# Patient Record
Sex: Female | Born: 1960 | Race: White | Hispanic: No | Marital: Married | Smoking: Never smoker
Health system: Southern US, Community
[De-identification: ages and names within clinical notes are randomized; demographics above are authoritative.]

## PROBLEM LIST (undated history)

## (undated) DIAGNOSIS — G4700 Insomnia, unspecified: Secondary | ICD-10-CM

## (undated) DIAGNOSIS — M1990 Unspecified osteoarthritis, unspecified site: Secondary | ICD-10-CM

## (undated) DIAGNOSIS — G4730 Sleep apnea, unspecified: Secondary | ICD-10-CM

## (undated) DIAGNOSIS — R51 Headache: Secondary | ICD-10-CM

## (undated) DIAGNOSIS — I82432 Acute embolism and thrombosis of left popliteal vein: Secondary | ICD-10-CM

## (undated) HISTORY — DX: Insomnia, unspecified: G47.00

## (undated) HISTORY — PX: BUNIONECTOMY: SHX129

## (undated) HISTORY — DX: Acute embolism and thrombosis of left popliteal vein: I82.432

## (undated) HISTORY — PX: JOINT REPLACEMENT: SHX530

---

## 2013-06-07 ENCOUNTER — Other Ambulatory Visit: Payer: Self-pay | Admitting: Neurological Surgery

## 2013-07-08 ENCOUNTER — Other Ambulatory Visit: Payer: Self-pay

## 2013-07-08 ENCOUNTER — Encounter (HOSPITAL_COMMUNITY): Payer: Self-pay

## 2013-07-08 ENCOUNTER — Ambulatory Visit (HOSPITAL_COMMUNITY)
Admission: RE | Admit: 2013-07-08 | Discharge: 2013-07-08 | Disposition: A | Discharge status: Home / Self Care | Payer: Commercial Managed Care - PPO | Source: Ambulatory Visit | Attending: Neurological Surgery | Admitting: Neurological Surgery

## 2013-07-08 ENCOUNTER — Encounter (HOSPITAL_COMMUNITY)
Admission: RE | Admit: 2013-07-08 | Discharge: 2013-07-08 | Disposition: A | Discharge status: Home / Self Care | Payer: Commercial Managed Care - PPO | Source: Ambulatory Visit | Attending: Neurological Surgery | Admitting: Neurological Surgery

## 2013-07-08 DIAGNOSIS — Z01818 Encounter for other preprocedural examination: Secondary | ICD-10-CM | POA: Insufficient documentation

## 2013-07-08 DIAGNOSIS — G4730 Sleep apnea, unspecified: Secondary | ICD-10-CM | POA: Insufficient documentation

## 2013-07-08 DIAGNOSIS — Z01812 Encounter for preprocedural laboratory examination: Secondary | ICD-10-CM | POA: Insufficient documentation

## 2013-07-08 DIAGNOSIS — Z01810 Encounter for preprocedural cardiovascular examination: Secondary | ICD-10-CM | POA: Insufficient documentation

## 2013-07-08 HISTORY — DX: Unspecified osteoarthritis, unspecified site: M19.90

## 2013-07-08 HISTORY — DX: Headache: R51

## 2013-07-08 HISTORY — DX: Sleep apnea, unspecified: G47.30

## 2013-07-08 LAB — TYPE AND SCREEN
ABO/RH(D): A POS
ANTIBODY SCREEN: NEGATIVE

## 2013-07-08 LAB — CBC WITH DIFFERENTIAL/PLATELET
Basophils Absolute: 0 10*3/uL (ref 0.0–0.1)
Basophils Relative: 1 % (ref 0–1)
EOS ABS: 0.1 10*3/uL (ref 0.0–0.7)
EOS PCT: 3 % (ref 0–5)
HCT: 38.8 % (ref 36.0–46.0)
HEMOGLOBIN: 12.4 g/dL (ref 12.0–15.0)
LYMPHS ABS: 1.2 10*3/uL (ref 0.7–4.0)
Lymphocytes Relative: 31 % (ref 12–46)
MCH: 28.3 pg (ref 26.0–34.0)
MCHC: 32 g/dL (ref 30.0–36.0)
MCV: 88.6 fL (ref 78.0–100.0)
MONOS PCT: 10 % (ref 3–12)
Monocytes Absolute: 0.4 10*3/uL (ref 0.1–1.0)
Neutro Abs: 2.1 10*3/uL (ref 1.7–7.7)
Neutrophils Relative %: 55 % (ref 43–77)
Platelets: 204 10*3/uL (ref 150–400)
RBC: 4.38 MIL/uL (ref 3.87–5.11)
RDW: 13.7 % (ref 11.5–15.5)
WBC: 3.8 10*3/uL — ABNORMAL LOW (ref 4.0–10.5)

## 2013-07-08 LAB — PROTIME-INR
INR: 1 (ref 0.00–1.49)
Prothrombin Time: 13 seconds (ref 11.6–15.2)

## 2013-07-08 LAB — BASIC METABOLIC PANEL
BUN: 11 mg/dL (ref 6–23)
CO2: 26 meq/L (ref 19–32)
Calcium: 9.6 mg/dL (ref 8.4–10.5)
Chloride: 103 mEq/L (ref 96–112)
Creatinine, Ser: 0.81 mg/dL (ref 0.50–1.10)
GFR calc Af Amer: >90 mL/min (ref >90)
GFR calc non Af Amer: 83 mL/min — ABNORMAL LOW (ref >90)
GLUCOSE: 81 mg/dL (ref 70–99)
POTASSIUM: 4.2 meq/L (ref 3.7–5.3)
Sodium: 142 mEq/L (ref 137–147)

## 2013-07-08 LAB — SURGICAL PCR SCREEN
MRSA, PCR: NEGATIVE
Staphylococcus aureus: NEGATIVE

## 2013-07-08 LAB — ABO/RH: ABO/RH(D): A POS

## 2013-07-08 NOTE — Pre-Procedure Instructions (Addendum)
Christine [name]  07/08/2013   Your procedure is scheduled on:  07-14-2013  Thursday    [facility] at 7:00 AM.   Call this number if you have problems the morning of surgery: [phone]   Remember:   Do not eat food or drink liquids after midnight.    Take these medicines the morning of surgery with A SIP OF WATER: Tramadol,verapamil,celebrex   Do not wear jewelry, make-up or nail polish.  Do not wear lotions, powders, or perfumes.   Do not shave 48 hours prior to surgery. Men may shave face and neck.  Do not bring valuables to the hospital.  [facility] any belongings or valuables.               Contacts, dentures or bridgework may not be worn into surgery.   Leave suitcase in the car. After surgery it may be brought to your room.   For patients admitted to the hospital, discharge time is determined by your treatment team.               Patients discharged the day of surgery will not be allowed to drive home.    Special Instructions: See Attached sheet for instructions on CHG shower/bath    Please read over the following fact sheets that you were given: Pain Booklet, Blood Transfusion Information and Surgical Site Infection Prevention

## 2013-07-08 NOTE — Progress Notes (Signed)
[facility]

## 2013-07-13 MED ORDER — CEFAZOLIN SODIUM-DEXTROSE 2-3 GM-% IV SOLR
2.0000 g | INTRAVENOUS | Status: AC
  Administered 2013-07-14 (×2): 2 g via INTRAVENOUS
  Filled 2013-07-13: qty 50

## 2013-07-14 ENCOUNTER — Encounter (HOSPITAL_COMMUNITY)
Admission: RE | Disposition: A | Discharge status: Home / Self Care | Payer: Commercial Managed Care - PPO | Source: Ambulatory Visit | Attending: Neurological Surgery

## 2013-07-14 ENCOUNTER — Encounter (HOSPITAL_COMMUNITY): Payer: Commercial Managed Care - PPO | Admitting: Anesthesiology

## 2013-07-14 ENCOUNTER — Inpatient Hospital Stay (HOSPITAL_COMMUNITY): Payer: Commercial Managed Care - PPO | Admitting: Anesthesiology

## 2013-07-14 ENCOUNTER — Inpatient Hospital Stay (HOSPITAL_COMMUNITY)
Admission: RE | Admit: 2013-07-14 | Discharge: 2013-07-16 | DRG: 458 | Disposition: A | Discharge status: Home / Self Care | Payer: Commercial Managed Care - PPO | Source: Ambulatory Visit | Attending: Neurological Surgery | Admitting: Neurological Surgery

## 2013-07-14 ENCOUNTER — Inpatient Hospital Stay (HOSPITAL_COMMUNITY): Payer: Commercial Managed Care - PPO

## 2013-07-14 ENCOUNTER — Encounter (HOSPITAL_COMMUNITY): Payer: Self-pay | Admitting: Anesthesiology

## 2013-07-14 DIAGNOSIS — M4120 Other idiopathic scoliosis, site unspecified: Principal | ICD-10-CM | POA: Diagnosis present

## 2013-07-14 DIAGNOSIS — M5137 Other intervertebral disc degeneration, lumbosacral region: Secondary | ICD-10-CM | POA: Diagnosis present

## 2013-07-14 DIAGNOSIS — Q762 Congenital spondylolisthesis: Secondary | ICD-10-CM

## 2013-07-14 DIAGNOSIS — G43909 Migraine, unspecified, not intractable, without status migrainosus: Secondary | ICD-10-CM | POA: Diagnosis present

## 2013-07-14 DIAGNOSIS — Z981 Arthrodesis status: Secondary | ICD-10-CM

## 2013-07-14 DIAGNOSIS — M51379 Other intervertebral disc degeneration, lumbosacral region without mention of lumbar back pain or lower extremity pain: Secondary | ICD-10-CM | POA: Diagnosis present

## 2013-07-14 HISTORY — PX: MAXIMUM ACCESS (MAS)POSTERIOR LUMBAR INTERBODY FUSION (PLIF) 2 LEVEL: SHX6369

## 2013-07-14 LAB — CBC
HEMATOCRIT: 32.9 % — AB (ref 36.0–46.0)
Hemoglobin: 10.6 g/dL — ABNORMAL LOW (ref 12.0–15.0)
MCH: 28 pg (ref 26.0–34.0)
MCHC: 32.2 g/dL (ref 30.0–36.0)
MCV: 87 fL (ref 78.0–100.0)
PLATELETS: 176 10*3/uL (ref 150–400)
RBC: 3.78 MIL/uL — ABNORMAL LOW (ref 3.87–5.11)
RDW: 13.7 % (ref 11.5–15.5)
WBC: 7 10*3/uL (ref 4.0–10.5)

## 2013-07-14 LAB — COMPREHENSIVE METABOLIC PANEL
ALBUMIN: 3.4 g/dL — AB (ref 3.5–5.2)
ALT: 8 U/L (ref 0–35)
AST: 14 U/L (ref 0–37)
Alkaline Phosphatase: 68 U/L (ref 39–117)
BUN: 8 mg/dL (ref 6–23)
CALCIUM: 8.6 mg/dL (ref 8.4–10.5)
CO2: 25 mEq/L (ref 19–32)
CREATININE: 0.72 mg/dL (ref 0.50–1.10)
Chloride: 102 mEq/L (ref 96–112)
GFR calc Af Amer: >90 mL/min (ref >90)
GFR calc non Af Amer: >90 mL/min (ref >90)
Glucose, Bld: 174 mg/dL — ABNORMAL HIGH (ref 70–99)
Potassium: 4.4 mEq/L (ref 3.7–5.3)
Sodium: 137 mEq/L (ref 137–147)
TOTAL PROTEIN: 6 g/dL (ref 6.0–8.3)
Total Bilirubin: 0.4 mg/dL (ref 0.3–1.2)

## 2013-07-14 LAB — GLUCOSE, CAPILLARY: Glucose-Capillary: 127 mg/dL — ABNORMAL HIGH (ref 70–99)

## 2013-07-14 SURGERY — FOR MAXIMUM ACCESS (MAS) POSTERIOR LUMBAR INTERBODY FUSION (PLIF) 2 LEVEL
Anesthesia: General | Site: Back

## 2013-07-14 MED ORDER — MORPHINE SULFATE 2 MG/ML IJ SOLN
1.0000 mg | INTRAMUSCULAR | Status: DC | PRN
  Administered 2013-07-14 (×2): 4 mg via INTRAVENOUS
  Administered 2013-07-15: 2 mg via INTRAVENOUS
  Administered 2013-07-15: 4 mg via INTRAVENOUS
  Filled 2013-07-14: qty 1
  Filled 2013-07-14 (×3): qty 2

## 2013-07-14 MED ORDER — HYDROMORPHONE HCL PF 1 MG/ML IJ SOLN
INTRAMUSCULAR | Status: AC
  Filled 2013-07-14: qty 2

## 2013-07-14 MED ORDER — EPHEDRINE SULFATE 50 MG/ML IJ SOLN
INTRAMUSCULAR | Status: AC
  Filled 2013-07-14: qty 1

## 2013-07-14 MED ORDER — NEOSTIGMINE METHYLSULFATE 10 MG/10ML IV SOLN
INTRAVENOUS | Status: AC
  Filled 2013-07-14: qty 1

## 2013-07-14 MED ORDER — MIDAZOLAM HCL 5 MG/5ML IJ SOLN
INTRAMUSCULAR | Status: DC | PRN
  Administered 2013-07-14: 2 mg via INTRAVENOUS

## 2013-07-14 MED ORDER — LACTATED RINGERS IV SOLN
INTRAVENOUS | Status: DC | PRN
  Administered 2013-07-14: 11:00:00 via INTRAVENOUS

## 2013-07-14 MED ORDER — OXYCODONE HCL 5 MG/5ML PO SOLN: 5.0000 mg | Freq: Once | ORAL | Status: AC | PRN

## 2013-07-14 MED ORDER — METHOCARBAMOL 500 MG PO TABS
ORAL_TABLET | ORAL | Status: AC
  Filled 2013-07-14: qty 1

## 2013-07-14 MED ORDER — HYDROMORPHONE HCL PF 1 MG/ML IJ SOLN
0.2500 mg | INTRAMUSCULAR | Status: DC | PRN
  Administered 2013-07-14 (×4): 0.5 mg via INTRAVENOUS

## 2013-07-14 MED ORDER — FENTANYL CITRATE 0.05 MG/ML IJ SOLN
INTRAMUSCULAR | Status: AC
  Filled 2013-07-14: qty 5

## 2013-07-14 MED ORDER — SUCCINYLCHOLINE CHLORIDE 20 MG/ML IJ SOLN
INTRAMUSCULAR | Status: AC
  Filled 2013-07-14: qty 1

## 2013-07-14 MED ORDER — PHENYLEPHRINE 40 MCG/ML (10ML) SYRINGE FOR IV PUSH (FOR BLOOD PRESSURE SUPPORT)
PREFILLED_SYRINGE | INTRAVENOUS | Status: AC
  Filled 2013-07-14: qty 10

## 2013-07-14 MED ORDER — ONDANSETRON HCL 4 MG/2ML IJ SOLN: 4.0000 mg | INTRAMUSCULAR | Status: DC | PRN

## 2013-07-14 MED ORDER — 0.9 % SODIUM CHLORIDE (POUR BTL) OPTIME
TOPICAL | Status: DC | PRN
  Administered 2013-07-14: 1000 mL

## 2013-07-14 MED ORDER — OXYCODONE HCL 5 MG PO TABS
ORAL_TABLET | ORAL | Status: AC
  Filled 2013-07-14: qty 1

## 2013-07-14 MED ORDER — PROPOFOL 10 MG/ML IV BOLUS
INTRAVENOUS | Status: DC | PRN
  Administered 2013-07-14: 180 mg via INTRAVENOUS

## 2013-07-14 MED ORDER — SUCCINYLCHOLINE CHLORIDE 20 MG/ML IJ SOLN
INTRAMUSCULAR | Status: DC | PRN
  Administered 2013-07-14: 120 mg via INTRAVENOUS

## 2013-07-14 MED ORDER — ACETAMINOPHEN 325 MG PO TABS: 650.0000 mg | ORAL_TABLET | ORAL | Status: DC | PRN

## 2013-07-14 MED ORDER — SODIUM CHLORIDE 0.9 % IJ SOLN: 3.0000 mL | INTRAMUSCULAR | Status: DC | PRN

## 2013-07-14 MED ORDER — SODIUM CHLORIDE 0.9 % IR SOLN
Status: DC | PRN
  Administered 2013-07-14: 11:00:00

## 2013-07-14 MED ORDER — OXYCODONE HCL 5 MG PO TABS
5.0000 mg | ORAL_TABLET | Freq: Once | ORAL | Status: AC | PRN
  Administered 2013-07-14: 5 mg via ORAL

## 2013-07-14 MED ORDER — OXYCODONE-ACETAMINOPHEN 5-325 MG PO TABS
1.0000 | ORAL_TABLET | ORAL | Status: DC | PRN
  Administered 2013-07-14 (×2): 1 via ORAL
  Administered 2013-07-15 – 2013-07-16 (×7): 2 via ORAL
  Filled 2013-07-14 (×5): qty 2
  Filled 2013-07-14: qty 1
  Filled 2013-07-14: qty 2
  Filled 2013-07-14: qty 1
  Filled 2013-07-14: qty 2

## 2013-07-14 MED ORDER — POTASSIUM CHLORIDE IN NACL 20-0.9 MEQ/L-% IV SOLN
INTRAVENOUS | Status: DC
  Filled 2013-07-14 (×3): qty 1000

## 2013-07-14 MED ORDER — SENNA 8.6 MG PO TABS
1.0000 | ORAL_TABLET | Freq: Two times a day (BID) | ORAL | Status: DC
  Administered 2013-07-14 – 2013-07-16 (×4): 8.6 mg via ORAL
  Filled 2013-07-14 (×7): qty 1

## 2013-07-14 MED ORDER — LIDOCAINE HCL (CARDIAC) 20 MG/ML IV SOLN
INTRAVENOUS | Status: DC | PRN
  Administered 2013-07-14: 80 mg via INTRAVENOUS

## 2013-07-14 MED ORDER — EPHEDRINE SULFATE 50 MG/ML IJ SOLN
INTRAMUSCULAR | Status: DC | PRN
  Administered 2013-07-14: 5 mg via INTRAVENOUS

## 2013-07-14 MED ORDER — FENTANYL CITRATE 0.05 MG/ML IJ SOLN
INTRAMUSCULAR | Status: DC | PRN
  Administered 2013-07-14: 25 ug via INTRAVENOUS
  Administered 2013-07-14 (×3): 50 ug via INTRAVENOUS
  Administered 2013-07-14: 25 ug via INTRAVENOUS
  Administered 2013-07-14: 50 ug via INTRAVENOUS
  Administered 2013-07-14: 100 ug via INTRAVENOUS

## 2013-07-14 MED ORDER — ONDANSETRON HCL 4 MG/2ML IJ SOLN
INTRAMUSCULAR | Status: DC | PRN
  Administered 2013-07-14: 4 mg via INTRAVENOUS

## 2013-07-14 MED ORDER — DIAZEPAM 5 MG/ML IJ SOLN
2.5000 mg | Freq: Once | INTRAMUSCULAR | Status: AC
  Administered 2013-07-14: 2.5 mg via INTRAVENOUS

## 2013-07-14 MED ORDER — ESTRADIOL 0.05 MG/24HR TD PTWK
0.0500 mg | MEDICATED_PATCH | TRANSDERMAL | Status: DC
  Filled 2013-07-14: qty 1

## 2013-07-14 MED ORDER — DEXTROSE 5 % IV SOLN
500.0000 mg | Freq: Four times a day (QID) | INTRAVENOUS | Status: DC | PRN
  Filled 2013-07-14: qty 5

## 2013-07-14 MED ORDER — BUPIVACAINE HCL (PF) 0.25 % IJ SOLN
INTRAMUSCULAR | Status: DC | PRN
  Administered 2013-07-14: 6 mL

## 2013-07-14 MED ORDER — GLYCOPYRROLATE 0.2 MG/ML IJ SOLN
INTRAMUSCULAR | Status: AC
  Filled 2013-07-14: qty 2

## 2013-07-14 MED ORDER — ZOLPIDEM TARTRATE 5 MG PO TABS
10.0000 mg | ORAL_TABLET | Freq: Every day | ORAL | Status: DC
  Administered 2013-07-14 – 2013-07-15 (×2): 10 mg via ORAL
  Filled 2013-07-14 (×2): qty 2

## 2013-07-14 MED ORDER — ACETAMINOPHEN 650 MG RE SUPP: 650.0000 mg | RECTAL | Status: DC | PRN

## 2013-07-14 MED ORDER — PHENOL 1.4 % MT LIQD
1.0000 | OROMUCOSAL | Status: DC | PRN
Start: 2013-07-14 — End: 2013-07-16

## 2013-07-14 MED ORDER — ROCURONIUM BROMIDE 50 MG/5ML IV SOLN
INTRAVENOUS | Status: AC
  Filled 2013-07-14: qty 1

## 2013-07-14 MED ORDER — PROPOFOL 10 MG/ML IV BOLUS
INTRAVENOUS | Status: AC
  Filled 2013-07-14: qty 20

## 2013-07-14 MED ORDER — DEXAMETHASONE 4 MG PO TABS
4.0000 mg | ORAL_TABLET | Freq: Four times a day (QID) | ORAL | Status: DC
  Administered 2013-07-14 – 2013-07-16 (×7): 4 mg via ORAL
  Filled 2013-07-14 (×14): qty 1

## 2013-07-14 MED ORDER — CEFAZOLIN SODIUM 1-5 GM-% IV SOLN
1.0000 g | Freq: Three times a day (TID) | INTRAVENOUS | Status: AC
  Administered 2013-07-14 – 2013-07-15 (×2): 1 g via INTRAVENOUS
  Filled 2013-07-14 (×2): qty 50

## 2013-07-14 MED ORDER — SODIUM CHLORIDE 0.9 % IJ SOLN
3.0000 mL | Freq: Two times a day (BID) | INTRAMUSCULAR | Status: DC
  Administered 2013-07-14 – 2013-07-15 (×2): 3 mL via INTRAVENOUS

## 2013-07-14 MED ORDER — VERAPAMIL HCL ER 120 MG PO TBCR
120.0000 mg | EXTENDED_RELEASE_TABLET | Freq: Every day | ORAL | Status: DC
  Administered 2013-07-15 – 2013-07-16 (×2): 120 mg via ORAL
  Filled 2013-07-14 (×2): qty 1

## 2013-07-14 MED ORDER — DEXAMETHASONE SODIUM PHOSPHATE 4 MG/ML IJ SOLN
4.0000 mg | Freq: Four times a day (QID) | INTRAMUSCULAR | Status: DC
  Filled 2013-07-14 (×8): qty 1

## 2013-07-14 MED ORDER — DEXAMETHASONE SODIUM PHOSPHATE 10 MG/ML IJ SOLN
INTRAMUSCULAR | Status: AC
  Filled 2013-07-14: qty 1

## 2013-07-14 MED ORDER — PHENTERMINE HCL 37.5 MG PO CAPS: 37.5000 mg | ORAL_CAPSULE | ORAL | Status: DC

## 2013-07-14 MED ORDER — DEXAMETHASONE SODIUM PHOSPHATE 10 MG/ML IJ SOLN
10.0000 mg | INTRAMUSCULAR | Status: AC
  Administered 2013-07-14: 10 mg via INTRAVENOUS

## 2013-07-14 MED ORDER — SODIUM CHLORIDE 0.9 % IJ SOLN
INTRAMUSCULAR | Status: AC
  Filled 2013-07-14: qty 10

## 2013-07-14 MED ORDER — MENTHOL 3 MG MT LOZG: 1.0000 | LOZENGE | OROMUCOSAL | Status: DC | PRN

## 2013-07-14 MED ORDER — THROMBIN 5000 UNITS EX SOLR
OROMUCOSAL | Status: DC | PRN
  Administered 2013-07-14: 11:00:00 via TOPICAL

## 2013-07-14 MED ORDER — METHOCARBAMOL 500 MG PO TABS
500.0000 mg | ORAL_TABLET | Freq: Four times a day (QID) | ORAL | Status: DC | PRN
  Administered 2013-07-14 – 2013-07-15 (×5): 500 mg via ORAL
  Filled 2013-07-14 (×5): qty 1

## 2013-07-14 MED ORDER — ARTIFICIAL TEARS OP OINT
TOPICAL_OINTMENT | OPHTHALMIC | Status: AC
  Filled 2013-07-14: qty 3.5

## 2013-07-14 MED ORDER — ARTIFICIAL TEARS OP OINT
TOPICAL_OINTMENT | OPHTHALMIC | Status: DC | PRN
  Administered 2013-07-14: 1 via OPHTHALMIC

## 2013-07-14 MED ORDER — LACTATED RINGERS IV SOLN
INTRAVENOUS | Status: DC
  Administered 2013-07-14: 07:00:00 via INTRAVENOUS

## 2013-07-14 MED ORDER — DIAZEPAM 5 MG/ML IJ SOLN
INTRAMUSCULAR | Status: AC
  Filled 2013-07-14: qty 2

## 2013-07-14 MED ORDER — LACTATED RINGERS IV SOLN
INTRAVENOUS | Status: DC | PRN
  Administered 2013-07-14 (×2): via INTRAVENOUS

## 2013-07-14 MED ORDER — ONDANSETRON HCL 4 MG/2ML IJ SOLN: 4.0000 mg | Freq: Four times a day (QID) | INTRAMUSCULAR | Status: DC | PRN

## 2013-07-14 MED ORDER — CELECOXIB 200 MG PO CAPS
200.0000 mg | ORAL_CAPSULE | Freq: Two times a day (BID) | ORAL | Status: DC
  Administered 2013-07-14 – 2013-07-16 (×4): 200 mg via ORAL
  Filled 2013-07-14 (×7): qty 1

## 2013-07-14 MED ORDER — PROPOFOL INFUSION 10 MG/ML OPTIME
INTRAVENOUS | Status: DC | PRN
  Administered 2013-07-14: 50 ug/kg/min via INTRAVENOUS

## 2013-07-14 MED ORDER — ONDANSETRON HCL 4 MG/2ML IJ SOLN
INTRAMUSCULAR | Status: AC
  Filled 2013-07-14: qty 2

## 2013-07-14 MED ORDER — MIDAZOLAM HCL 2 MG/2ML IJ SOLN
INTRAMUSCULAR | Status: AC
  Filled 2013-07-14: qty 2

## 2013-07-14 MED ORDER — THROMBIN 20000 UNITS EX SOLR
CUTANEOUS | Status: DC | PRN
  Administered 2013-07-14: 11:00:00 via TOPICAL

## 2013-07-14 MED ORDER — SODIUM CHLORIDE 0.9 % IV SOLN
INTRAVENOUS | Status: DC | PRN
  Administered 2013-07-14: 13:00:00 via INTRAVENOUS

## 2013-07-14 SURGICAL SUPPLY — 68 items
BAG DECANTER FOR FLEXI CONT (MISCELLANEOUS) ×3 IMPLANT
BENZOIN TINCTURE PRP APPL 2/3 (GAUZE/BANDAGES/DRESSINGS) ×3 IMPLANT
BLADE 10 SAFETY STRL DISP (BLADE) ×3 IMPLANT
BLADE SURG ROTATE 9660 (MISCELLANEOUS) IMPLANT
BONE MATRIX OSTEOCEL PRO MED (Bone Implant) ×3 IMPLANT
BUR MATCHSTICK NEURO 3.0 LAGG (BURR) ×3 IMPLANT
CAGE COROENT MP 8X9X23M-8 SPIN (Cage) ×12 IMPLANT
CANISTER SUCT 3000ML (MISCELLANEOUS) ×3 IMPLANT
CLIP NEUROVISION LG (CLIP) ×3 IMPLANT
CLOSURE WOUND 1/2 X4 (GAUZE/BANDAGES/DRESSINGS) ×2
CONT SPEC 4OZ CLIKSEAL STRL BL (MISCELLANEOUS) ×6 IMPLANT
COVER BACK TABLE 24X17X13 BIG (DRAPES) IMPLANT
COVER TABLE BACK 60X90 (DRAPES) ×3 IMPLANT
DRAPE C-ARM 42X72 X-RAY (DRAPES) ×3 IMPLANT
DRAPE C-ARMOR (DRAPES) ×3 IMPLANT
DRAPE LAPAROTOMY 100X72X124 (DRAPES) ×3 IMPLANT
DRAPE POUCH INSTRU U-SHP 10X18 (DRAPES) ×3 IMPLANT
DRAPE SURG 17X23 STRL (DRAPES) ×3 IMPLANT
DRSG OPSITE 4X5.5 SM (GAUZE/BANDAGES/DRESSINGS) ×3 IMPLANT
DRSG OPSITE POSTOP 4X6 (GAUZE/BANDAGES/DRESSINGS) ×3 IMPLANT
DRSG TELFA 3X8 NADH (GAUZE/BANDAGES/DRESSINGS) ×3 IMPLANT
DURAPREP 26ML APPLICATOR (WOUND CARE) ×3 IMPLANT
ELECT REM PT RETURN 9FT ADLT (ELECTROSURGICAL) ×3
ELECTRODE REM PT RTRN 9FT ADLT (ELECTROSURGICAL) ×1 IMPLANT
EVACUATOR 1/8 PVC DRAIN (DRAIN) ×3 IMPLANT
GAUZE SPONGE 4X4 16PLY XRAY LF (GAUZE/BANDAGES/DRESSINGS) IMPLANT
GLOVE BIO SURGEON STRL SZ8 (GLOVE) ×6 IMPLANT
GLOVE BIO SURGEON STRL SZ8.5 (GLOVE) ×3 IMPLANT
GLOVE BIOGEL PI IND STRL 7.0 (GLOVE) ×3 IMPLANT
GLOVE BIOGEL PI IND STRL 8.5 (GLOVE) ×1 IMPLANT
GLOVE BIOGEL PI INDICATOR 7.0 (GLOVE) ×6
GLOVE BIOGEL PI INDICATOR 8.5 (GLOVE) ×2
GLOVE SS BIOGEL STRL SZ 6.5 (GLOVE) ×4 IMPLANT
GLOVE SS BIOGEL STRL SZ 8 (GLOVE) ×2 IMPLANT
GLOVE SUPERSENSE BIOGEL SZ 6.5 (GLOVE) ×8
GLOVE SUPERSENSE BIOGEL SZ 8 (GLOVE) ×4
GOWN STRL REUS W/ TWL LRG LVL3 (GOWN DISPOSABLE) ×2 IMPLANT
GOWN STRL REUS W/ TWL XL LVL3 (GOWN DISPOSABLE) ×3 IMPLANT
GOWN STRL REUS W/TWL 2XL LVL3 (GOWN DISPOSABLE) IMPLANT
GOWN STRL REUS W/TWL LRG LVL3 (GOWN DISPOSABLE) ×4
GOWN STRL REUS W/TWL XL LVL3 (GOWN DISPOSABLE) ×6
HEMOSTAT POWDER KIT SURGIFOAM (HEMOSTASIS) IMPLANT
KIT BASIN OR (CUSTOM PROCEDURE TRAY) ×3 IMPLANT
KIT ROOM TURNOVER OR (KITS) ×3 IMPLANT
MILL MEDIUM DISP (BLADE) ×3 IMPLANT
NEEDLE HYPO 25X1 1.5 SAFETY (NEEDLE) ×3 IMPLANT
NS IRRIG 1000ML POUR BTL (IV SOLUTION) ×3 IMPLANT
PACK LAMINECTOMY NEURO (CUSTOM PROCEDURE TRAY) ×3 IMPLANT
PAD ARMBOARD 7.5X6 YLW CONV (MISCELLANEOUS) ×9 IMPLANT
ROD PLIF MAS 65MM (Rod) ×6 IMPLANT
SCREW LOCK (Screw) ×12 IMPLANT
SCREW LOCK FXNS SPNE MAS PL (Screw) ×6 IMPLANT
SCREW PLIF MAS 5.0X35 (Screw) ×12 IMPLANT
SCREW SHANK 5.0X30MM (Screw) ×6 IMPLANT
SCREW TULIP 5.5 (Screw) ×6 IMPLANT
SPONGE LAP 4X18 X RAY DECT (DISPOSABLE) IMPLANT
SPONGE SURGIFOAM ABS GEL 100 (HEMOSTASIS) ×3 IMPLANT
STRIP CLOSURE SKIN 1/2X4 (GAUZE/BANDAGES/DRESSINGS) ×4 IMPLANT
SUT VIC AB 0 CT1 18XCR BRD8 (SUTURE) ×1 IMPLANT
SUT VIC AB 0 CT1 8-18 (SUTURE) ×2
SUT VIC AB 2-0 CP2 18 (SUTURE) ×3 IMPLANT
SUT VIC AB 3-0 SH 8-18 (SUTURE) ×6 IMPLANT
SYR 20ML ECCENTRIC (SYRINGE) ×3 IMPLANT
SYR 3ML LL SCALE MARK (SYRINGE) IMPLANT
TOWEL OR 17X24 6PK STRL BLUE (TOWEL DISPOSABLE) ×3 IMPLANT
TOWEL OR 17X26 10 PK STRL BLUE (TOWEL DISPOSABLE) ×3 IMPLANT
TRAY FOLEY CATH 14FRSI W/METER (CATHETERS) ×3 IMPLANT
WATER STERILE IRR 1000ML POUR (IV SOLUTION) ×3 IMPLANT

## 2013-07-14 NOTE — Transfer of Care (Signed)
Immediate Anesthesia Transfer of Care Note  Patient: Christine [name]  Procedure(s) Performed: Procedure(s): FOR MAXIMUM ACCESS (MAS) POSTERIOR LUMBAR INTERBODY FUSION (PLIF) 2 LEVEL lumbar three/four and four/five (N/A)  Patient Location: PACU  Anesthesia Type:General  Level of Consciousness: awake, alert  and oriented  Airway & Oxygen Therapy: Patient Spontanous Breathing and Patient connected to face mask oxygen  Post-op Assessment: Report given to PACU RN  Post vital signs: Reviewed and stable  Complications: No apparent anesthesia complications

## 2013-07-14 NOTE — H&P (Signed)
Subjective: Patient is a 53 y.o. female admitted for PLIF. Onset of symptoms was a few years ago, gradually worsening since that time.  The pain is rated severe, and is located at the across the lower back and radiates to legs. The pain is described as aching and occurs all day. The symptoms have been progressive. Symptoms are exacerbated by lying down. MRI or CT showed DDD L4-5, spondylolisthesis L3-4   Past Medical History  Diagnosis Date  . Sleep apnea     does not use CPAP  . Arthritis   . Headache(784.0)     hx.migraines/takes verapamil    Past Surgical History  Procedure Laterality Date  . Bunionectomy    . Cesarean section      times 2    Prior to Admission medications   Medication Sig Start Date End Date Taking? Authorizing Provider  BIOTIN PO Take 1 tablet by mouth daily.   Yes Historical Provider, MD  celecoxib (CELEBREX) 200 MG capsule Take 200 mg by mouth daily.   Yes Historical Provider, MD  Cholecalciferol (VITAMIN D3) 2000 UNITS TABS Take 2,000 Units by mouth daily.   Yes Historical Provider, MD  docusate sodium (COLACE) 100 MG capsule Take 100-200 mg by mouth 2 (two) times daily. Take 200mg  in the morning and 100mg  at bedtime.   Yes Historical Provider, MD  estradiol (VIVELLE-DOT) 0.0375 MG/24HR Place 1 patch onto the skin once a week. Saturday   Yes Historical Provider, MD  phentermine 37.5 MG capsule Take 37.5 mg by mouth every morning.   Yes Historical Provider, MD  Probiotic Product (PROBIOTIC PO) Take 1 tablet by mouth daily.   Yes Historical Provider, MD  traMADol (ULTRAM) 50 MG tablet Take 100 mg by mouth every 6 (six) hours as needed (break through pain).    Yes Historical Provider, MD  traMADol (ULTRAM-ER) 200 MG 24 hr tablet Take 200 mg by mouth daily.   Yes Historical Provider, MD  verapamil (CALAN-SR) 120 MG CR tablet Take 120 mg by mouth daily.   Yes Historical Provider, MD  zolpidem (AMBIEN) 10 MG tablet Take 10 mg by mouth at bedtime.   Yes Historical  Provider, MD  metaxalone (SKELAXIN) 800 MG tablet Take 800 mg by mouth 3 (three) times daily as needed for muscle spasms.    Historical Provider, MD   No Known Allergies  History  Substance Use Topics  . Smoking status: Never Smoker   . Smokeless tobacco: Not on file  . Alcohol Use: No    History reviewed. No pertinent family history.   Review of Systems  Positive ROS: neg  All other systems have been reviewed and were otherwise negative with the exception of those mentioned in the HPI and as above.  Objective: Vital signs in last 24 hours: Temp:  [98.8 F (37.1 C)] 98.8 F (37.1 C) (06/18 0716) Pulse Rate:  [73] 73 (06/18 0716) Resp:  [18] 18 (06/18 0716) BP: (130)/(81) 130/81 mmHg (06/18 0716) SpO2:  [100 %] 100 % (06/18 0716)  General Appearance: Alert, cooperative, no distress, appears stated age Head: Normocephalic, without obvious abnormality, atraumatic Eyes: PERRL, conjunctiva/corneas clear, EOM's intact    Neck: Supple, symmetrical, trachea midline Back: Symmetric, no curvature, ROM normal, no CVA tenderness Lungs:  respirations unlabored Heart: Regular rate and rhythm Abdomen: Soft, non-tender Extremities: Extremities normal, atraumatic, no cyanosis or edema Pulses: 2+ and symmetric all extremities Skin: Skin color, texture, turgor normal, no rashes or lesions  NEUROLOGIC:   Mental status: Alert and  oriented x4,  no aphasia, good attention span, fund of knowledge, and memory Motor Exam - grossly normal Sensory Exam - grossly normal Reflexes: 1+ Coordination - grossly normal Gait - grossly normal Balance - grossly normal Cranial Nerves: I: smell Not tested  II: visual acuity  OS: nl    OD: nl  II: visual fields Full to confrontation  II: pupils Equal, round, reactive to light  III,VII: ptosis None  III,IV,VI: extraocular muscles  Full ROM  V: mastication Normal  V: facial light touch sensation  Normal  V,VII: corneal reflex  Present  VII: facial  muscle function - upper  Normal  VII: facial muscle function - lower Normal  VIII: hearing Not tested  IX: soft palate elevation  Normal  IX,X: gag reflex Present  XI: trapezius strength  5/5  XI: sternocleidomastoid strength 5/5  XI: neck flexion strength  5/5  XII: tongue strength  Normal    Data Review Lab Results  Component Value Date   WBC 3.8* 07/08/2013   HGB 12.4 07/08/2013   HCT 38.8 07/08/2013   MCV 88.6 07/08/2013   PLT 204 07/08/2013   Lab Results  Component Value Date   NA 142 07/08/2013   K 4.2 07/08/2013   CL 103 07/08/2013   CO2 26 07/08/2013   BUN 11 07/08/2013   CREATININE 0.81 07/08/2013   GLUCOSE 81 07/08/2013   Lab Results  Component Value Date   INR 1.00 07/08/2013    Assessment/Plan: Patient admitted for PLIF L3-4, L4-5. Patient has failed a reasonable attempt at conservative therapy.  I explained the condition and procedure to the patient and answered any questions.  Patient wishes to proceed with procedure as planned. Understands risks/ benefits and typical outcomes of procedure.   Christine [name] S 07/14/2013 9:08 AM

## 2013-07-14 NOTE — Anesthesia Postprocedure Evaluation (Signed)
Anesthesia Post Note  Patient: Venia CarbonSharon P Loflin  Procedure(s) Performed: Procedure(s) (LRB): FOR MAXIMUM ACCESS (MAS) POSTERIOR LUMBAR INTERBODY FUSION (PLIF) 2 LEVEL lumbar three/four and four/five (N/A)  Anesthesia type: General  Patient location: PACU  Post pain: Pain level controlled and Adequate analgesia  Post assessment: Post-op Vital signs reviewed, Patient's Cardiovascular Status Stable, Respiratory Function Stable, Patent Airway and Pain level controlled  Last Vitals:  Filed Vitals:   07/14/13 1507  BP: 117/74  Pulse: 76  Temp:   Resp: 7    Post vital signs: Reviewed and stable  Level of consciousness: awake, alert  and oriented  Complications: No apparent anesthesia complications

## 2013-07-14 NOTE — Progress Notes (Signed)
Nurse tech called RN to room to assess patient. Patient stated " I feel like I am going to pass out" and I feel nauseous" RN noticed patient was sitting on the recliner, clammy and not responding to voice. Oxygen was already placed on Patient via [place] d/t patient's history of OSA.(patient stated she does not wear CPAP at home). Oxygen was increased to 4L and fluids was increased.  Patient still not responding to voice and code blue was initiated per protocol. MD was notified of event.  RN called report to Neuro ICU. Patient transferred to 3M05, and patient's spouse was notified of event by RN. Marin RobertsAisha Ibrahim RN

## 2013-07-14 NOTE — Op Note (Signed)
07/14/2013  1:33 PM  PATIENT:  Christine [name]  53 y.o. 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 Decompressive lumbar laminectomy L3-4 and L4-5 requiring more work than would be required for a simple exposure of the disk for PLIF in order to adequately decompress the neural elements and address the spinal stenosis 2. Posterior lumbar interbody fusion L3-4 and L4-5 using PEEK interbody cages packed with morcellized allograft and autograft 3. Posterior fixation L3-L5 inclusive utilizing cortical pedicle screws .    SURGEON:  Marikay Alaravid Jones, MD  ASSISTANTS: Dr. Lovell SheehanJenkins  ANESTHESIA:  General  EBL: 400 ml  Total I/O In: 1950 [I.V.:1950] Out: 900 [Urine:500; Blood:400]  BLOOD ADMINISTERED:none  DRAINS: Hemovac   INDICATION FOR PROCEDURE: This patient presented with a long history of back and leg pain. She had a plain films which showed scoliosis with lateral listhesis of L3 on L4 and L4-L5 with spondylolisthesis L3-4 and severe degenerative disease L4-5. MRI confirmed this along with spinal stenosis. She tried medical management without relief. Recommended a decompression instrumented fusion. Patient understood the risks, benefits, and alternatives and potential outcomes and wished to proceed.  PROCEDURE DETAILS:  The patient was brought to the operating room. After induction of generalized endotracheal anesthesia the patient was rolled into the prone position on chest rolls and all pressure points were padded. The patient's lumbar region was cleaned and then prepped with DuraPrep and draped in the usual sterile fashion. Anesthesia was injected and then a dorsal midline incision was made and carried down to the lumbosacral fascia. The fascia was opened and the paraspinous musculature was taken down in a subperiosteal fashion to expose  L3-4 and L4-5. A self-retaining retractor was placed. Intraoperative fluoroscopy confirmed my level, and I started with placement of the L3 cortical pedicle screws. The pedicle screw entry zones were identified utilizing surface landmarks and  AP and lateral fluoroscopy. I scored the cortex with the high-speed drill and then used the hand drill and EMG monitoring to drill an upward and outward direction into the pedicle. I then tapped line to line, and the tap was also monitored. I then placed a 5-0 x 30 mm cortical pedicle screw into the pedicles of L3 bilaterally. I then turned my attention to the decompression and the spinous process was removed and complete lumbar laminectomies, hemi- facetectomies, and foraminotomies were performed at L3-4 and L4-5. The patient had significant spinal stenosis and this required more work than would be required for a simple exposure of the disc for posterior lumbar interbody fusion. Much more generous decompression was undertaken in order to adequately decompress the neural elements and address the patient's leg pain. The yellow ligament was removed to expose the underlying dura and nerve roots, and generous foraminotomies were performed to adequately decompress the neural elements. Both the exiting and traversing nerve roots were decompressed on both sides until a coronary dilator passed easily along the nerve roots. Once the decompression was complete, I turned my attention to the posterior lower lumbar interbody fusion. The epidural venous vasculature was coagulated and cut sharply. Disc space was incised and the initial discectomy was performed with pituitary rongeurs. The disc space was distracted with sequential distractors to a height of 8 mm. We then used a series of scrapers and shavers to prepare the endplates for fusion. The midline was prepared with Epstein curettes. Once the complete  discectomy was finished, we packed an appropriate sized peek interbody cage with  local autograft and morcellized allograft, gently retracted the nerve root, and tapped the cage into position at L3-4 and L4-5.  The midline between the cages was packed with morselized autograft and allograft. We then turned our attention to the placement of the lower pedicle screws. The pedicle screw entry zones were identified utilizing surface landmarks and fluoroscopy. I drilled into each pedicle utilizing the hand drill and EMG monitoring, and tapped each pedicle with the appropriate tap. We palpated with a ball probe to assure no break in the cortex. We then placed 5-0 x 35 mm cortical pedicle screws into the pedicles bilaterally at L4 and L5. We then placed lordotic rods into the multiaxial screw heads of the pedicle screws and locked these in position with the locking caps and anti-torque device. We then checked our construct with AP and lateral fluoroscopy. Irrigated with copious amounts of bacitracin-containing saline solution. Placed a medium Hemovac drain through separate stab incision. Inspected the nerve roots once again to assure adequate decompression, lined to the dura with Gelfoam, and closed the muscle and the fascia with 0 Vicryl. Closed the subcutaneous tissues with 2-0 Vicryl and subcuticular tissues with 3-0 Vicryl. The skin was closed with benzoin and Steri-Strips. Dressing was then applied, the patient was awakened from general anesthesia and transported to the recovery room in stable condition. At the end of the procedure all sponge, needle and instrument counts were correct.   PLAN OF CARE: Admit to inpatient   PATIENT DISPOSITION:  PACU - hemodynamically stable.   Delay start of Pharmacological VTE agent (>24hrs) due to surgical blood loss or risk of bleeding:  yes

## 2013-07-14 NOTE — Anesthesia Preprocedure Evaluation (Signed)
Anesthesia Evaluation  Patient identified by MRN, date of birth, ID band Patient awake    Reviewed: Allergy & Precautions, H&P , NPO status , Patient's Chart, lab work & pertinent test results  Airway Mallampati: II  Neck ROM: full    Dental   Pulmonary sleep apnea ,          Cardiovascular negative cardio ROS      Neuro/Psych  Headaches,    GI/Hepatic   Endo/Other  obese  Renal/GU      Musculoskeletal  (+) Arthritis -,   Abdominal   Peds  Hematology   Anesthesia Other Findings   Reproductive/Obstetrics                           Anesthesia Physical Anesthesia Plan  ASA: II  Anesthesia Plan: General   Post-op Pain Management:    Induction: Intravenous  Airway Management Planned: Oral ETT  Additional Equipment:   Intra-op Plan:   Post-operative Plan: Extubation in OR  Informed Consent: I have reviewed the patients History and Physical, chart, labs and discussed the procedure including the risks, benefits and alternatives for the proposed anesthesia with the patient or authorized representative who has indicated his/her understanding and acceptance.     Plan Discussed with: CRNA, Anesthesiologist and Surgeon  Anesthesia Plan Comments:         Anesthesia Quick Evaluation

## 2013-07-14 NOTE — Progress Notes (Signed)
Patient states that she cannot tolerate CPAP.  She stated that she has tried for 6 months at home to wear different masks and machines and does not wish to try while she is here.  RT made patient aware that if she changed her mind that she could let RN know and RT would bring machine.

## 2013-07-14 NOTE — Progress Notes (Signed)
eLink Physician-Brief Progress Note Patient Name: Christine [name] DOB: 1960-05-19 MRN: [id number]  Date of Service  07/14/2013   HPI/Events of Note   Reason for transfer noted - no obvious reason for low BP - doubt true arrest. CBG ok, BP ok, Hb ok  eICU Interventions  H/o OSA, advised CPAP but pt refuses Monitor in ICU overnight   Intervention Category Major Interventions: Other:  New ICU patient evaluation: [facility]. I have reviewed relevant documentation including care plan & orders.  ALVA,RAKESH V. 07/14/2013, 9:48 PM

## 2013-07-14 NOTE — Code Documentation (Signed)
Staff at bedside called Code Blue. Patient was sitting in chair, unresponsive to voice or sternal rub, unable to feel pulse, shallow rr. Patient cold and clammy Assisted patient to bed.  Back dressing Clean, dry, intact.  No bruising.  Hemovac with minimal drainage NS fluid bolus initiated, CPR less than 30 sec.   Patient began to arouse.  Initially only able to squeeze hand then fully alert and able to communicate No monitor present at beginning of code, placed on zoll, SR 60s, placed on 4L [place] O2 sat 98% 200 cc NS given CBG 127  BP 102/40 then 124/76 2nd IV placed Labs drawn and sent Code team at bedside Dr Franky Machoabbell at bedside Patient transferred to 3M05 for closer observation RN to notify husband of event

## 2013-07-15 LAB — GLUCOSE, CAPILLARY
GLUCOSE-CAPILLARY: 138 mg/dL — AB (ref 70–99)
Glucose-Capillary: 127 mg/dL — ABNORMAL HIGH (ref 70–99)

## 2013-07-15 MED FILL — Medication: Qty: 1 | Status: AC

## 2013-07-15 NOTE — Progress Notes (Signed)
Patient ID: Christine [name], female   DOB: [birth year], 53 y.o.   MRN: [id number] Subjective: Patient reports no chest pain or shortness of breath, back pain is tolerable, no leg pain or numbness tingling or weakness.  Objective: Vital signs in last 24 hours: Temp:  [97.8 F (36.6 C)-98.4 F (36.9 C)] 98 F (36.7 C) (06/19 0320) Pulse Rate:  [65-86] 71 (06/19 0400) Resp:  [7-22] 12 (06/19 0400) BP: (105-126)/(43-95) 109/65 mmHg (06/19 0400) SpO2:  [95 %-100 %] 95 % (06/19 0400) Weight:  [94.9 kg (209 lb 3.5 oz)] 94.9 kg (209 lb 3.5 oz) (06/18 1828)  Intake/Output from previous day: 06/18 0701 - 06/19 0700 In: 4298 [P.O.:1320; I.V.:2803; Blood:125; IV Piggyback:50] Out: 1150 [Urine:700; Drains:50; Blood:400] Intake/Output this shift:    Neurologic: Grossly normal, good strength, awake and alert and conversant Vital signs are stable and normal blood pressure and heart rate, normal rhythm strip  Lab Results: Lab Results  Component Value Date   WBC 7.0 07/14/2013   HGB 10.6* 07/14/2013   HCT 32.9* 07/14/2013   MCV 87.0 07/14/2013   PLT 176 07/14/2013   Lab Results  Component Value Date   INR 1.00 07/08/2013   BMET Lab Results  Component Value Date   NA 137 07/14/2013   K 4.4 07/14/2013   CL 102 07/14/2013   CO2 25 07/14/2013   GLUCOSE 174* 07/14/2013   BUN 8 07/14/2013   CREATININE 0.72 07/14/2013   CALCIUM 8.6 07/14/2013    Studies/Results: Dg Lumbar Spine 2-3 Views  07/14/2013   CLINICAL DATA:  Surgical posterior fusion.  EXAM: DG C-ARM 61-120 MIN; LUMBAR SPINE - 2-3 VIEW  COMPARISON:  May 17, 2013.  FINDINGS: Two intraoperative fluoroscopic images of the lower lumbar spine were submitted for review. These images demonstrate the patient be status post posterior fusion of L3, L4 and L5 with bilateral intrapedicular screw placement and interbody fusion. Good alignment of vertebral bodies is noted.  IMPRESSION: See above.   Electronically Signed   By: Roque LiasJames  Green M.D.   On:  07/14/2013 14:02   Dg C-arm 61-120 Min  07/14/2013   CLINICAL DATA:  Surgical posterior fusion.  EXAM: DG C-ARM 61-120 MIN; LUMBAR SPINE - 2-3 VIEW  COMPARISON:  May 17, 2013.  FINDINGS: Two intraoperative fluoroscopic images of the lower lumbar spine were submitted for review. These images demonstrate the patient be status post posterior fusion of L3, L4 and L5 with bilateral intrapedicular screw placement and interbody fusion. Good alignment of vertebral bodies is noted.  IMPRESSION: See above.   Electronically Signed   By: Roque LiasJames  Green M.D.   On: 07/14/2013 14:02    Assessment/Plan: Doing well. Transfer to floor today and start physical and occupational therapy. Events of last evening unclear at this point. Talking her today it sounds most likely syncopal event, and it appears she does this when she becomes nauseous per her report.    LOS: 1 day    JONES,DAVID S 07/15/2013, 7:38 AM

## 2013-07-15 NOTE — Plan of Care (Signed)
Problem: Consults Goal: Diagnosis - Spinal Surgery Lumbar Laminectomy (Complex) L3-5

## 2013-07-15 NOTE — Progress Notes (Addendum)
Pt transfered from 3 MW alert awake and oriented x 4 and follow instruction approprietly. initial assessment done. Pt verbalized pain The patient is able to obtain temporary relief   rated 4/10 on pain scale. Pt in chair eating lunch. Call light within reach.

## 2013-07-15 NOTE — Progress Notes (Signed)
Occupational Therapy Evaluation Patient Details Name: Christine [name] MRN: [id number] DOB: 10-04-1960 Today's Date: 07/15/2013    History of Present Illness Patient is a 53 y.o. female admitted for MAS PLIF   Clinical Impression   PTA, pt independent with ADL and mobility. Pt making excellent progress. Completed all education regarding mobility and ADL  S/p back surgery. Pt ready for D/C when medically stable. OT signing off. THank you.    Follow Up Recommendations  No OT follow up;Supervision - Intermittent    Equipment Recommendations  None recommended by OT    Recommendations for Other Services       Precautions / Restrictions Precautions Precautions: Back Precaution Booklet Issued: Yes (comment) Required Braces or Orthoses: Spinal Brace Spinal Brace: Applied in sitting position;Lumbar corset Restrictions Weight Bearing Restrictions: No      Mobility Bed Mobility Pt up in chair  Transfers Overall transfer level: Needs assistance Equipment used: Rolling walker (2 wheeled) Transfers: Sit to/from Stand Sit to Stand: Supervision             Balance Overall balance assessment: No apparent balance deficits (not formally assessed)                                          ADL Overall ADL's : Needs assistance/impaired     Grooming: Supervision/safety   Upper Body Bathing: Supervision/ safety;Set up;Sitting   Lower Body Bathing: Supervison/ safety;Set up;Sit to/from stand   Upper Body Dressing : Set up   Lower Body Dressing: Supervision/safety;Set up;Sit to/from stand   Toilet Transfer: Supervision/safety   Toileting- ArchitectClothing Manipulation and Hygiene: Supervision/safety;Sit to/from stand       Functional mobility during ADLs: Supervision/safety General ADL Comments: Educated pt on back precuations for ADL with use of AE and compensatory techniques. Pt verbalized/demosntrated understanding.     Vision                      Perception     Praxis      Pertinent Vitals/Pain No c/o pain. VSS     Hand Dominance Right   Extremity/Trunk Assessment Upper Extremity Assessment Upper Extremity Assessment: Overall WFL for tasks assessed   Lower Extremity Assessment Lower Extremity Assessment: Overall WFL for tasks assessed   Cervical / Trunk Assessment Cervical / Trunk Assessment: Normal   Communication Communication Communication: No difficulties   Cognition Arousal/Alertness: Awake/alert Behavior During Therapy: WFL for tasks assessed/performed Overall Cognitive Status: Within Functional Limits for tasks assessed                     General Comments       Exercises       Shoulder Instructions      Home Living Family/patient expects to be discharged to:: Private residence Living Arrangements: Spouse/significant other;Children Available Help at Discharge: Family Type of Home: House Home Access: Stairs to enter Secretary/administratorntrance Stairs-Number of Steps: 4 Entrance Stairs-Rails: Right Home Layout: Two level;Bed/bath upstairs Alternate Level Stairs-Number of Steps: 12 Alternate Level Stairs-Rails: Right;Left Bathroom Shower/Tub: Producer, television/film/videoWalk-in shower   Bathroom Toilet: Standard Bathroom Accessibility: Yes How Accessible: Accessible via walker Home Equipment: Walker - 2 wheels;Bedside commode;Shower seat - built in;Hand held shower head;Wheelchair - manual          Prior Functioning/Environment Level of Independence: Independent             OT Diagnosis:  OT Problem List:     OT Treatment/Interventions:      OT Goals(Current goals can be found in the care plan section) Acute Rehab OT Goals Patient Stated Goal: to go home OT Goal Formulation:  (eval only)  OT Frequency:     Barriers to D/C:            Co-evaluation              End of Session Equipment Utilized During Treatment: Back brace Nurse Communication: Mobility status  Activity Tolerance: Patient  tolerated treatment well Patient left: in chair;with call bell/phone within reach   Time: [id number] OT Time Calculation (min): 18 min Charges:  OT General Charges $OT Visit: 1 Procedure OT Evaluation $Initial OT Evaluation Tier I: 1 Procedure OT Treatments $Self Care/Home Management : 8-22 mins G-Codes:    WARD,HILLARY 07/15/2013, 10:17 AM   Luisa DagoHilary Ward, OTR/L  [phone] 07/15/2013

## 2013-07-15 NOTE — Evaluation (Signed)
Physical Therapy Evaluation Patient Details Name: Christine CarbonSharon P Patchell MRN: [id number] DOB: 11-Apr-1960 Today's Date: 07/15/2013   History of Present Illness  Patient is a 53 y.o. female admitted for PLIF  Clinical Impression  Patient demonstrates deficits in mobility as indicated below. Will benefit from continued skilled PT to address deficits and maximize function. Will see as indicated and progress as tolerated.  Educated patient extensively regarding back precautions, home management, mobility expectations and pain control techniques.    Follow Up Recommendations No PT follow up    Equipment Recommendations  None recommended by PT    Recommendations for Other Services       Precautions / Restrictions Precautions Precautions: Back Precaution Booklet Issued: Yes (comment) Required Braces or Orthoses: Spinal Brace Spinal Brace: Applied in sitting position;Lumbar corset Restrictions Weight Bearing Restrictions: No      Mobility  Bed Mobility Overal bed mobility: Needs Assistance Bed Mobility: Rolling;Sidelying to Sit Rolling: Supervision Sidelying to sit: Supervision       General bed mobility comments: VCs for positioning and technique  Transfers Overall transfer level: Needs assistance Equipment used: Rolling walker (2 wheeled) Transfers: Sit to/from Stand Sit to Stand: Supervision         General transfer comment: VCs for hand placement and safety  Ambulation/Gait Ambulation/Gait assistance: Supervision Ambulation Distance (Feet): 160 Feet Assistive device: Rolling walker (2 wheeled) Gait Pattern/deviations: Step-through pattern Gait velocity: initially decreased but improved with VCs Gait velocity interpretation: at or above normal speed for age/gender General Gait Details: steady with ambulation  Stairs            Wheelchair Mobility    Modified Rankin (Stroke Patients Only)       Balance                                              Pertinent Vitals/Pain 5/10    Home Living Family/patient expects to be discharged to:: Private residence Living Arrangements: Spouse/significant other;Children Available Help at Discharge: Family Type of Home: House Home Access: Stairs to enter Entrance Stairs-Rails: Right Entrance Stairs-Number of Steps: 4 Home Layout: Two level;Bed/bath upstairs Home Equipment: Walker - 2 wheels;Bedside commode;Shower seat - built in;Hand held shower head;Wheelchair - manual      Prior Function Level of Independence: Independent               Hand Dominance   Dominant Hand: Right    Extremity/Trunk Assessment   Upper Extremity Assessment: Overall WFL for tasks assessed           Lower Extremity Assessment: Overall WFL for tasks assessed      Cervical / Trunk Assessment: Normal  Communication   Communication: No difficulties  Cognition Arousal/Alertness: Awake/alert Behavior During Therapy: WFL for tasks assessed/performed Overall Cognitive Status: Within Functional Limits for tasks assessed                      General Comments      Exercises        Assessment/Plan    PT Assessment Patient needs continued PT services  PT Diagnosis Difficulty walking;Acute pain   PT Problem List Decreased activity tolerance;Decreased balance;Decreased mobility;Pain  PT Treatment Interventions DME instruction;Gait training;Stair training;Functional mobility training;Therapeutic activities;Therapeutic exercise;Balance training;Patient/family education   PT Goals (Current goals can be found in the Care Plan section) Acute Rehab PT Goals Patient Stated  Goal: to go home PT Goal Formulation: With patient Time For Goal Achievement: 07/22/13 Potential to Achieve Goals: Good    Frequency Min 5X/week   Barriers to discharge        Co-evaluation               End of Session Equipment Utilized During Treatment: Gait belt;Back brace Activity Tolerance:  Patient tolerated treatment well Patient left: in chair;with call bell/phone within reach Nurse Communication: Mobility status         Time: [id number] PT Time Calculation (min): 28 min   Charges:   PT Evaluation $Initial PT Evaluation Tier I: 1 Procedure PT Treatments $Gait Training: 8-22 mins $Self Care/Home Management: 8-22   PT G CodesFabio Asa:          Werner, Devon J 07/15/2013, 10:16 AM Charlotte Crumbevon Werner, PT DPT  [phone]

## 2013-07-15 NOTE — Progress Notes (Signed)
Patient continues to refuse CPAP therapy. 

## 2013-07-16 MED ORDER — METHOCARBAMOL 500 MG PO TABS: 500.0000 mg | ORAL_TABLET | Freq: Four times a day (QID) | ORAL | Status: DC | PRN

## 2013-07-16 MED ORDER — OXYCODONE-ACETAMINOPHEN 5-325 MG PO TABS: 1.0000 | ORAL_TABLET | ORAL | Status: DC | PRN

## 2013-07-16 MED ORDER — BISACODYL 10 MG RE SUPP
10.0000 mg | Freq: Every day | RECTAL | Status: DC | PRN
  Filled 2013-07-16: qty 1

## 2013-07-16 MED ORDER — POLYETHYLENE GLYCOL 3350 17 G PO PACK
17.0000 g | PACK | Freq: Every day | ORAL | Status: DC
  Filled 2013-07-16: qty 1

## 2013-07-16 NOTE — Progress Notes (Signed)
Physical Therapy Treatment Patient Details Name: Christine CarbonSharon P [name] MRN: [id number] DOB: 1960/05/02 Today's Date: 07/16/2013    History of Present Illness Patient is a 53 y.o. female admitted for PLIF    PT Comments    Pt moving well and ready to d/c home.  Pt has completed stair negotiation. Educated on proper car transfer and pt feels comfortable and has no further questions.   Follow Up Recommendations  No PT follow up     Equipment Recommendations  None recommended by PT    Recommendations for Other Services       Precautions / Restrictions Precautions Precautions: Back Precaution Booklet Issued: Yes (comment) Required Braces or Orthoses: Spinal Brace Spinal Brace: Applied in sitting position;Lumbar corset Restrictions Weight Bearing Restrictions: No    Mobility  Bed Mobility                  Transfers Overall transfer level: Independent Equipment used: Rolling walker (2 wheeled);None Transfers: Sit to/from Stand Sit to Stand: Independent            Ambulation/Gait Ambulation/Gait assistance: Independent Ambulation Distance (Feet): 200 Feet Assistive device: None Gait Pattern/deviations: Step-through pattern Gait velocity: wnl       Stairs Stairs: Yes Stairs assistance: Modified independent (Device/Increase time) Stair Management: One rail Right Number of Stairs: 10    Wheelchair Mobility    Modified Rankin (Stroke Patients Only)       Balance                                    Cognition Arousal/Alertness: Awake/alert Behavior During Therapy: WFL for tasks assessed/performed Overall Cognitive Status: Within Functional Limits for tasks assessed                      Exercises      General Comments        Pertinent Vitals/Pain VSS    Home Living                      Prior Function            PT Goals (current goals can now be found in the care plan section) Acute Rehab PT  Goals Patient Stated Goal: to go home PT Goal Formulation: With patient Time For Goal Achievement: 07/22/13 Potential to Achieve Goals: Good    Frequency  Min 5X/week    PT Plan Current plan remains appropriate    Co-evaluation             End of Session Equipment Utilized During Treatment: Gait belt;Back brace Activity Tolerance: Patient tolerated treatment well Patient left: in chair;with call bell/phone within reach     Time: 0902-0918 PT Time Calculation (min): 16 min  Charges:  $Gait Training: 8-22 mins                    G Codes:      HOUSTON,WENDY 07/16/2013, 9:21 AM

## 2013-07-16 NOTE — Discharge Summary (Signed)
Physician Discharge Summary  Patient ID: Christine [name] MRN: [id number] DOB/AGE: [birth year] 51 y.o.  Admit date: 07/14/2013 Discharge date: 07/16/2013  Admission Diagnoses:  Lumbar spinal stenosis L3-4 and L4-5 with scoliosis, lateral listhesis, and spondylolisthesis, back and leg pain  Discharge Diagnoses:  Lumbar spinal stenosis L3-4 and L4-5 with scoliosis, lateral listhesis, and spondylolisthesis, back and leg pain  Active Problems:   S/P lumbar spinal fusion   Discharged Condition: good  Hospital Course: Patient was admitted underwent an L3-4 and L4-5 lumbar decompression and arthrodesis by Dr. Yetta BarreJones 2 days ago. Postoperatively she had an episode of unresponsiveness, and the patient was seen by rapid response and transferred to the ICU for observation overnight. She's had no further difficulties and subsequently was transferred to the floor, and at this point is up and ambulating actively, comfortable, and asking to be discharged to home. She's been given instructions regarding wound care and activities following discharge, and she is to contact Dr. Yetta BarreJones a secretary at the beginning of the week to determine a followup date and time.  Discharge Exam: Blood pressure 104/68, pulse 82, temperature 98.7 F (37.1 C), temperature source Oral, resp. rate 16, height 5\' 5"  (1.651 m), weight 94.9 kg (209 lb 3.5 oz), SpO2 98.00%.  Disposition: Home     Medication List         BIOTIN PO  Take 1 tablet by mouth daily.     celecoxib 200 MG capsule  Commonly known as:  CELEBREX  Take 200 mg by mouth daily.     docusate sodium 100 MG capsule  Commonly known as:  COLACE  Take 100-200 mg by mouth 2 (two) times daily. Take 200mg  in the morning and 100mg  at bedtime.     estradiol 0.0375 MG/24HR  Commonly known as:  VIVELLE-DOT  Place 1 patch onto the skin once a week. Saturday     metaxalone 800 MG tablet  Commonly known as:  SKELAXIN  Take 800 mg by mouth 3 (three) times daily as  needed for muscle spasms.     methocarbamol 500 MG tablet  Commonly known as:  ROBAXIN  Take 1 tablet (500 mg total) by mouth every 6 (six) hours as needed for muscle spasms.     oxyCODONE-acetaminophen 5-325 MG per tablet  Commonly known as:  PERCOCET/ROXICET  Take 1-2 tablets by mouth every 4 (four) hours as needed for moderate pain or severe pain.     phentermine 37.5 MG capsule  Take 37.5 mg by mouth every morning.     PROBIOTIC PO  Take 1 tablet by mouth daily.     traMADol 200 MG 24 hr tablet  Commonly known as:  ULTRAM-ER  Take 200 mg by mouth daily.     traMADol 50 MG tablet  Commonly known as:  ULTRAM  Take 100 mg by mouth every 6 (six) hours as needed (break through pain).     verapamil 120 MG CR tablet  Commonly known as:  CALAN-SR  Take 120 mg by mouth daily.     Vitamin D3 2000 UNITS Tabs  Take 2,000 Units by mouth daily.     zolpidem 10 MG tablet  Commonly known as:  AMBIEN  Take 10 mg by mouth at bedtime.         Signed: Hewitt ShortsNUDELMAN,ROBERT W, MD 07/16/2013, 8:34 AM

## 2013-07-17 ENCOUNTER — Encounter (HOSPITAL_COMMUNITY): Payer: Self-pay | Admitting: Neurological Surgery

## 2013-07-18 MED FILL — Sodium Chloride Irrigation Soln 0.9%: Qty: 3000 | Status: AC

## 2013-07-18 MED FILL — Heparin Sodium (Porcine) Inj 1000 Unit/ML: INTRAMUSCULAR | Qty: 30 | Status: AC

## 2013-07-18 MED FILL — Sodium Chloride IV Soln 0.9%: INTRAVENOUS | Qty: 1000 | Status: AC

## 2013-11-23 ENCOUNTER — Other Ambulatory Visit: Payer: Self-pay

## 2013-11-23 DIAGNOSIS — Z1231 Encounter for screening mammogram for malignant neoplasm of breast: Secondary | ICD-10-CM

## 2013-12-09 ENCOUNTER — Ambulatory Visit: Payer: Commercial Managed Care - PPO

## 2018-02-08 LAB — HM COLONOSCOPY

## 2019-02-09 LAB — HM MAMMOGRAPHY: HM Mammogram: NORMAL (ref 0–4)

## 2019-03-02 ENCOUNTER — Encounter: Payer: Self-pay | Admitting: Physician Assistant

## 2019-03-02 ENCOUNTER — Ambulatory Visit: Payer: Commercial Managed Care - PPO | Admitting: Physician Assistant

## 2019-03-02 ENCOUNTER — Other Ambulatory Visit: Payer: Self-pay

## 2019-03-02 VITALS — BP 108/62 | HR 69 | Temp 98.2°F | Resp 16 | Ht 66.0 in | Wt 236.0 lb

## 2019-03-02 DIAGNOSIS — G894 Chronic pain syndrome: Secondary | ICD-10-CM

## 2019-03-02 DIAGNOSIS — T7840XA Allergy, unspecified, initial encounter: Secondary | ICD-10-CM | POA: Insufficient documentation

## 2019-03-02 DIAGNOSIS — J302 Other seasonal allergic rhinitis: Secondary | ICD-10-CM | POA: Diagnosis not present

## 2019-03-02 DIAGNOSIS — F419 Anxiety disorder, unspecified: Secondary | ICD-10-CM

## 2019-03-02 DIAGNOSIS — T7840XD Allergy, unspecified, subsequent encounter: Secondary | ICD-10-CM

## 2019-03-02 MED ORDER — TRAMADOL HCL ER 300 MG PO TB24: 300.0000 mg | ORAL_TABLET | Freq: Every day | ORAL | 0 refills | Status: DC

## 2019-03-02 NOTE — Progress Notes (Signed)
Established Patient Office Visit  Subjective:  Patient ID: Christine [name], female    DOB: 1960-02-24  Age: 59 y.o. MRN: [id number]  CC:  Chief Complaint  Patient presents with  . Follow-up  . Hypertension  . Depression    HPI WEI NEWBROUGH presents for follow up of allergies, migraines and chronic osteoarthritic pain  Pt states she is currently taking singular for allergies - controlling symptoms well and voices no concerns  Pt states she takes calan SR for prevention of migraines - she has been on this for over 10 years and doing well on medication with rare breakthrough headaches  Pt is taking citalopram qd for anxiety - controls symptoms well  Pt states that she takes tramadol 300mg  qd for chronic osteoarthritis pain and then uses 50mg  as needed for breakthrough pain (she states that she does not use this medication regularly)  Past Medical History:  Diagnosis Date  . Acute embolism and thrombosis of left popliteal vein (HCC)   . Arthritis   . Headache(784.0)    hx.migraines/takes verapamil  . Insomnia   . Sleep apnea    does not use CPAP    Past Surgical History:  Procedure Laterality Date  . BUNIONECTOMY    . CESAREAN SECTION     times 2  . JOINT REPLACEMENT    . MAXIMUM ACCESS (MAS)POSTERIOR LUMBAR INTERBODY FUSION (PLIF) 2 LEVEL N/A 07/14/2013   Procedure: FOR MAXIMUM ACCESS (MAS) POSTERIOR LUMBAR INTERBODY FUSION (PLIF) 2 LEVEL lumbar three/four and four/five;  Surgeon: , MD;  Location: [facility];  Service: Neurosurgery;  Laterality: N/A;    Family History  Problem Relation Age of Onset  . Atrial fibrillation Mother   . Cancer Mother   . CAD Father   . Diabetes Father   . Hypertension Father   . Cancer Father   . Cancer Maternal Aunt     Social History   Socioeconomic History  . Marital status: Married    Spouse name: Not on file  . Number of children: 2  . Years of education: Not on file  . Highest education level: Not  on file  Occupational History  . Not on file  Tobacco Use  . Smoking status: Never Smoker  . Smokeless tobacco: Never Used  Substance and Sexual Activity  . Alcohol use: Never  . Drug use: Never  . Sexual activity: Not on file  Other Topics Concern  . 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 Difficulty of Paying Living Expenses: Not on file  Food Insecurity:   . Worried About Tia Alert in the Last Year: Not on file  . Ran Out of Food in the Last Year: Not on file  Transportation Needs:   . Lack of Transportation (Medical): Not on file  . Lack of Transportation (Non-Medical): Not on file  Physical Activity:   . Days of Exercise per Week: Not on file  . Minutes of Exercise per Session: Not on file  Stress:   . Feeling of Stress : Not on file  Social Connections:   . Frequency of Communication with Friends and Family: Not on file  . Frequency of Social Gatherings with Friends and Family: Not on file  . Attends Religious Services: Not on file  . Active Member of Clubs or Organizations: Not on file  . Attends Club or  Organization Meetings: Not on file  . Marital Status: Not on file  Intimate Partner Violence:   . Fear of Current or Ex-Partner: Not on file  . Emotionally Abused: Not on file  . Physically Abused: Not on file  . Sexually Abused: Not on file    Outpatient Medications Prior to Visit  Medication Sig Dispense Refill  . Apoaequorin (PREVAGEN EXTRA STRENGTH PO) Take by mouth.    Marland Kitchen BIOTIN PO Take 1 tablet by mouth daily.    . celecoxib (CELEBREX) 200 MG capsule Take 200 mg by mouth daily.    . Cholecalciferol (VITAMIN D3) 2000 UNITS TABS Take 2,000 Units by mouth daily.    . citalopram (CELEXA) 20 MG tablet Take 20 mg by mouth daily.    Marland Kitchen docusate sodium (COLACE) 100 MG capsule Take 100-200 mg by mouth 2 (two) times daily. Take 200mg  in the morning and 100mg  at  bedtime.    . traMADol (ULTRAM) 50 MG tablet Take 100 mg by mouth every 6 (six) hours as needed (break through pain).     . verapamil (CALAN-SR) 120 MG CR tablet Take 120 mg by mouth daily.    Marland Kitchen zolpidem (AMBIEN) 10 MG tablet Take 10 mg by mouth at bedtime.    . traMADol (ULTRAM-ER) 300 MG 24 hr tablet Take 300 mg by mouth daily.    . montelukast (SINGULAIR) 10 MG tablet Take 10 mg by mouth daily.    Marland Kitchen estradiol (VIVELLE-DOT) 0.0375 MG/24HR Place 1 patch onto the skin once a week. Saturday    . metaxalone (SKELAXIN) 800 MG tablet Take 800 mg by mouth 3 (three) times daily as needed for muscle spasms.    . methocarbamol (ROBAXIN) 500 MG tablet Take 1 tablet (500 mg total) by mouth every 6 (six) hours as needed for muscle spasms. (Patient not taking: Reported on 03/02/2019) 60 tablet 0  . oxyCODONE-acetaminophen (PERCOCET/ROXICET) 5-325 MG per tablet Take 1-2 tablets by mouth every 4 (four) hours as needed for moderate pain or severe pain. (Patient not taking: Reported on 03/02/2019) 80 tablet 0  . phentermine 37.5 MG capsule Take 37.5 mg by mouth every morning.    . Probiotic Product (PROBIOTIC PO) Take 1 tablet by mouth daily.    . traMADol (ULTRAM-ER) 200 MG 24 hr tablet Take 200 mg by mouth daily.     No facility-administered medications prior to visit.    Allergies  Allergen Reactions  . Topamax [Topiramate]     ROS Review of Systems CONSTITUTIONAL: Negative for chills, fatigue, fever, unintentional weight gain and unintentional weight loss.  E/N/T: Negative for ear pain, nasal congestion and sore throat.  CARDIOVASCULAR: Negative for chest pain, dizziness, palpitations and pedal edema.  RESPIRATORY: Negative for recent cough and dyspnea.  GASTROINTESTINAL: Negative for abdominal pain, acid reflux symptoms, constipation, diarrhea, nausea and vomiting.  MSK: see HPI INTEGUMENTARY: Negative for rash.  NEUROLOGICAL: Negative for dizziness and headaches.  PSYCHIATRIC: Negative for sleep  disturbance and to question depression screen.  Negative for depression, negative for anhedonia.        Objective:    Physical Exam  BP 108/62   Pulse 69   Temp 98.2 F (36.8 C)   Resp 16   Ht 5\' 6"  (1.676 m)   Wt 236 lb (107 kg)   SpO2 98%   BMI 38.09 kg/m  Wt Readings from Last 3 Encounters:  03/02/19 236 lb (107 kg)  07/14/13 209 lb 3.5 oz (94.9 kg)  07/08/13 210 lb 4.8  oz (95.4 kg)   PHYSICAL EXAM:   VS: BP 108/62   Pulse 69   Temp 98.2 F (36.8 C)   Resp 16   Ht 5\' 6"  (1.676 m)   Wt 236 lb (107 kg)   SpO2 98%   BMI 38.09 kg/m   GEN: Well nourished, well developed, in no acute distress  Cardiac: RRR; no murmurs, rubs, or gallops,no edema - no significant varicosities Respiratory:  normal respiratory rate and pattern with no distress - normal breath sounds with no rales, rhonchi, wheezes or rubs GI: normal bowel sounds, no masses or tenderness MS: no deformity or atrophy - actually patient moves about room well with no trouble Skin: warm and dry, no rash  Neuro:  Alert and Oriented x 3, Strength and sensation are intact - CN II-Xii grossly intact Psych: euthymic mood, appropriate affect and demeanor   Health Maintenance Due  Topic Date Due  . Hepatitis C Screening  [birth year]  . HIV Screening  12/10/1977  . TETANUS/TDAP  12/10/1981  . PAP SMEAR-Modifier  12/11/1983  . MAMMOGRAM  12/10/2012  . COLONOSCOPY  12/10/2012  . INFLUENZA VACCINE  08/28/2018    There are no preventive care reminders to display for this patient.  No results found for: TSH Lab Results  Component Value Date   WBC 7.0 07/14/2013   HGB 10.6 (L) 07/14/2013   HCT 32.9 (L) 07/14/2013   MCV 87.0 07/14/2013   PLT 176 07/14/2013   Lab Results  Component Value Date   NA 137 07/14/2013   K 4.4 07/14/2013   CO2 25 07/14/2013   GLUCOSE 174 (H) 07/14/2013   BUN 8 07/14/2013   CREATININE 0.72 07/14/2013   BILITOT 0.4 07/14/2013   ALKPHOS 68 07/14/2013   AST 14 07/14/2013   ALT 8  07/14/2013   PROT 6.0 07/14/2013   ALBUMIN 3.4 (L) 07/14/2013   CALCIUM 8.6 07/14/2013   No results found for: CHOL No results found for: HDL No results found for: LDLCALC No results found for: TRIG No results found for: CHOLHDL No results found for: 07/16/2013    Assessment & Plan:   Problem List Items Addressed This Visit      Other   Chronic pain syndrome    Continue current meds as directed and recommend patient schedule cpx with labwork      Relevant Medications   citalopram (CELEXA) 20 MG tablet   traMADol (ULTRAM-ER) 300 MG 24 hr tablet   Allergies - Primary    Continue current meds as directed       Other Visit Diagnoses    Anxiety       Relevant Medications   citalopram (CELEXA) 20 MG tablet      Meds ordered this encounter  Medications  . traMADol (ULTRAM-ER) 300 MG 24 hr tablet    Sig: Take 1 tablet (300 mg total) by mouth daily.    Dispense:  30 tablet    Refill:  0    Order Specific Question:   Supervising Provider    [email or web address]    Follow-up: Return in about 3 months (around 05/30/2019).    SARA R Evart Mcdonnell, PA-C

## 2019-03-02 NOTE — Assessment & Plan Note (Signed)
Continue current meds as directed 

## 2019-03-02 NOTE — Assessment & Plan Note (Signed)
Continue current meds as directed and recommend patient schedule cpx with labwork

## 2019-03-25 ENCOUNTER — Other Ambulatory Visit: Payer: Self-pay | Admitting: Physician Assistant

## 2019-03-25 NOTE — Telephone Encounter (Signed)
Denial faxed to pharmacy.

## 2019-04-01 ENCOUNTER — Other Ambulatory Visit: Payer: Self-pay | Admitting: Physician Assistant

## 2019-04-27 ENCOUNTER — Other Ambulatory Visit: Payer: Self-pay | Admitting: Physician Assistant

## 2019-04-29 ENCOUNTER — Other Ambulatory Visit: Payer: Self-pay | Admitting: Family Medicine

## 2019-05-02 ENCOUNTER — Other Ambulatory Visit: Payer: Self-pay | Admitting: Physician Assistant

## 2019-05-23 ENCOUNTER — Other Ambulatory Visit: Payer: Self-pay | Admitting: Family Medicine

## 2019-05-24 ENCOUNTER — Other Ambulatory Visit: Payer: Self-pay

## 2019-05-24 MED ORDER — CITALOPRAM HYDROBROMIDE 20 MG PO TABS: 20.0000 mg | ORAL_TABLET | Freq: Every day | ORAL | 0 refills | Status: DC

## 2019-05-24 MED ORDER — VERAPAMIL HCL ER 120 MG PO TBCR: 120.0000 mg | EXTENDED_RELEASE_TABLET | Freq: Every day | ORAL | 0 refills | Status: DC

## 2019-05-24 MED ORDER — CELECOXIB 200 MG PO CAPS: 200.0000 mg | ORAL_CAPSULE | Freq: Every day | ORAL | 0 refills | Status: DC

## 2019-05-25 ENCOUNTER — Other Ambulatory Visit: Payer: Self-pay | Admitting: Physician Assistant

## 2019-05-30 ENCOUNTER — Other Ambulatory Visit: Payer: Self-pay | Admitting: Physician Assistant

## 2019-06-28 ENCOUNTER — Other Ambulatory Visit: Payer: Self-pay

## 2019-06-28 MED ORDER — TRAMADOL HCL ER 300 MG PO TB24: 300.0000 mg | ORAL_TABLET | Freq: Every day | ORAL | 0 refills | Status: DC

## 2019-07-25 ENCOUNTER — Other Ambulatory Visit: Payer: Self-pay

## 2019-07-25 MED ORDER — TRAMADOL HCL ER 300 MG PO TB24: 300.0000 mg | ORAL_TABLET | Freq: Every day | ORAL | 0 refills | Status: DC

## 2019-07-30 ENCOUNTER — Other Ambulatory Visit: Payer: Self-pay | Admitting: Family Medicine

## 2019-08-22 ENCOUNTER — Other Ambulatory Visit: Payer: Self-pay | Admitting: Physician Assistant

## 2019-08-22 ENCOUNTER — Other Ambulatory Visit: Payer: Self-pay

## 2019-08-25 ENCOUNTER — Encounter: Payer: Self-pay | Admitting: Physician Assistant

## 2019-08-25 ENCOUNTER — Ambulatory Visit: Payer: Commercial Managed Care - PPO | Admitting: Physician Assistant

## 2019-08-25 ENCOUNTER — Other Ambulatory Visit: Payer: Self-pay

## 2019-08-25 VITALS — BP 142/82 | HR 78 | Temp 97.8°F | Resp 98 | Ht 66.0 in | Wt 231.0 lb

## 2019-08-25 DIAGNOSIS — E559 Vitamin D deficiency, unspecified: Secondary | ICD-10-CM | POA: Diagnosis not present

## 2019-08-25 DIAGNOSIS — G894 Chronic pain syndrome: Secondary | ICD-10-CM | POA: Diagnosis not present

## 2019-08-25 DIAGNOSIS — Z23 Encounter for immunization: Secondary | ICD-10-CM | POA: Insufficient documentation

## 2019-08-25 DIAGNOSIS — J309 Allergic rhinitis, unspecified: Secondary | ICD-10-CM | POA: Insufficient documentation

## 2019-08-25 DIAGNOSIS — F419 Anxiety disorder, unspecified: Secondary | ICD-10-CM | POA: Insufficient documentation

## 2019-08-25 DIAGNOSIS — J3089 Other allergic rhinitis: Secondary | ICD-10-CM | POA: Diagnosis not present

## 2019-08-25 DIAGNOSIS — E782 Mixed hyperlipidemia: Secondary | ICD-10-CM

## 2019-08-25 MED ORDER — TRAMADOL HCL ER 300 MG PO TB24: 300.0000 mg | ORAL_TABLET | Freq: Every day | ORAL | 0 refills | Status: DC

## 2019-08-25 NOTE — Assessment & Plan Note (Signed)
Continue meds. 

## 2019-08-25 NOTE — Progress Notes (Signed)
Established Patient Office Visit  Subjective:  Patient ID: Christine [name], female    DOB: [birth year]  Age: 59 y.o. MRN: [id number]  CC:  Chief Complaint  Patient presents with  . Hypertension    Fasting follow up    HPI Christine [name] presents for follow up of allergies, migraines and chronic osteoarthritic pain  Pt states she is currently taking singular for allergies - controlling symptoms well and voices no concerns  Pt states she takes calan SR for prevention of migraines - she has been on this for over 10 years and doing well on medication with rare breakthrough headaches  Pt is taking citalopram qd for anxiety - controls symptoms well - she is currently on 20mg  qd - voices no problems or   Pt states that she takes tramadol 300mg  qd for chronic osteoarthritis pain and then uses 50mg  as needed for breakthrough pain (she states that she does not use this medication regularly)she also uses celebrex 200mg  qd  Past Medical History:  Diagnosis Date  . Acute embolism and thrombosis of left popliteal vein (HCC)   . Arthritis   . Headache(784.0)    hx.migraines/takes verapamil  . Insomnia   . Sleep apnea    does not use CPAP    Past Surgical History:  Procedure Laterality Date  . BUNIONECTOMY    . CESAREAN SECTION     times 2  . JOINT REPLACEMENT    . MAXIMUM ACCESS (MAS)POSTERIOR LUMBAR INTERBODY FUSION (PLIF) 2 LEVEL N/A 07/14/2013   Procedure: FOR MAXIMUM ACCESS (MAS) POSTERIOR LUMBAR INTERBODY FUSION (PLIF) 2 LEVEL lumbar three/four and four/five;  Surgeon: , MD;  Location: [facility];  Service: Neurosurgery;  Laterality: N/A;    Family History  Problem Relation Age of Onset  . Atrial fibrillation Mother   . Cancer Mother   . CAD Father   . Diabetes Father   . Hypertension Father   . Cancer Father   . Cancer Maternal Aunt     Social History   Socioeconomic History  . Marital status: Married    Spouse name: Not on file  . Number of  children: 2  . Years of education: Not on file  . Highest education level: Not on file  Occupational History  . Not on file  Tobacco Use  . Smoking status: Never Smoker  . Smokeless tobacco: Never Used  Vaping Use  . Vaping Use: Never used  Substance and Sexual Activity  . Alcohol use: Never  . Drug use: Never  . Sexual activity: Not on file  Other Topics Concern  . 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 Difficulty of Paying Living Expenses:   Food Insecurity:   . Worried About in the Last Year:   . 07/16/2013 in the Last Year:   Transportation Needs:   . Tia Alert (Medical):   [employer] Lack of Transportation (Non-Medical):   Physical Activity:   . Days of Exercise per Week:   . Minutes of Exercise per Session:   Stress:   . Feeling of Stress :   Social Connections:   . Frequency of Communication with Friends and Family:   . Frequency of Social Gatherings with Friends and Family:   . Attends Religious Services:   . Active Member of Clubs or Organizations:   .  Attends Banker Meetings:   Marland Kitchen Marital Status:   Intimate Partner Violence:   . Fear of Current or Ex-Partner:   . Emotionally Abused:   Marland Kitchen Physically Abused:   . Sexually Abused:     Outpatient Medications Prior to Visit  Medication Sig Dispense Refill  . Apoaequorin (PREVAGEN EXTRA STRENGTH PO) Take by mouth.    Marland Kitchen BIOTIN PO Take 1 tablet by mouth daily.    . celecoxib (CELEBREX) 200 MG capsule Take 1 capsule (200 mg total) by mouth daily. 90 capsule 0  . Cholecalciferol (VITAMIN D3) 2000 UNITS TABS Take 2,000 Units by mouth daily.    . citalopram (CELEXA) 20 MG tablet Take 1 tablet (20 mg total) by mouth daily. 90 tablet 0  . docusate sodium (COLACE) 100 MG capsule Take 100-200 mg by mouth 2 (two) times daily. Take 200mg  in the morning and 100mg  at bedtime.    . montelukast  (SINGULAIR) 10 MG tablet Take 10 mg by mouth daily.    . traMADol (ULTRAM) 50 MG tablet Take 100 mg by mouth every 6 (six) hours as needed (break through pain).     . traMADol (ULTRAM-ER) 300 MG 24 hr tablet Take 1 tablet (300 mg total) by mouth daily. 30 tablet 0  . verapamil (CALAN-SR) 120 MG CR tablet Take 1 tablet (120 mg total) by mouth daily. 90 tablet 0  . zolpidem (AMBIEN) 10 MG tablet Take 1 tablet by mouth once daily at bedtime 30 tablet 5   No facility-administered medications prior to visit.    Allergies  Allergen Reactions  . Topamax [Topiramate]     ROS Review of Systems CONSTITUTIONAL: Negative for chills, fatigue, fever, unintentional weight gain and unintentional weight loss.  CARDIOVASCULAR: Negative for chest pain, dizziness, palpitations and pedal edema.  RESPIRATORY: Negative for recent cough and dyspnea.  GASTROINTESTINAL: Negative for abdominal pain, acid reflux symptoms, constipation, diarrhea, nausea and vomiting.  MSK: see HPI INTEGUMENTARY: Negative for rash.  PSYCHIATRIC: Negative for sleep disturbance and to question depression screen.  Negative for depression, negative for anhedonia.        Objective:    Physical Exam  BP (!) 142/82 (BP Location: Left Arm, Patient Position: Sitting)   Pulse 78   Temp 97.8 F (36.6 C) (Temporal)   Resp (!) 98   Ht 5\' 6"  (1.676 m)   Wt (!) 231 lb (104.8 kg)   BMI 37.28 kg/m  Wt Readings from Last 3 Encounters:  08/25/19 (!) 231 lb (104.8 kg)  03/02/19 236 lb (107 kg)  07/14/13 209 lb 3.5 oz (94.9 kg)   PHYSICAL EXAM:   VS: BP (!) 142/82 (BP Location: Left Arm, Patient Position: Sitting)   Pulse 78   Temp 97.8 F (36.6 C) (Temporal)   Resp (!) 98   Ht 5\' 6"  (1.676 m)   Wt (!) 231 lb (104.8 kg)   BMI 37.28 kg/m   GEN: Well nourished, well developed, in no acute distress  Cardiac: RRR; no murmurs, rubs, or gallops,no edema - no significant varicosities Respiratory:  normal respiratory rate and pattern  with no distress - normal breath sounds with no rales, rhonchi, wheezes or rubs MS: no deformity or atrophy - actually patient moves about room well with no trouble but she is seeing Dr 08/27/19 in a few weeks to correct a trigger thumb Skin: warm and dry, no rash  Psych: euthymic mood, appropriate affect and demeanor   Health Maintenance Due  Topic Date Due  .  TETANUS/TDAP  Never done  . PAP SMEAR-Modifier  12/06/2018    There are no preventive care reminders to display for this patient.  No results found for: TSH Lab Results  Component Value Date   WBC 7.0 07/14/2013   HGB 10.6 (L) 07/14/2013   HCT 32.9 (L) 07/14/2013   MCV 87.0 07/14/2013   PLT 176 07/14/2013   Lab Results  Component Value Date   NA 137 07/14/2013   K 4.4 07/14/2013   CO2 25 07/14/2013   GLUCOSE 174 (H) 07/14/2013   BUN 8 07/14/2013   CREATININE 0.72 07/14/2013   BILITOT 0.4 07/14/2013   ALKPHOS 68 07/14/2013   AST 14 07/14/2013   ALT 8 07/14/2013   PROT 6.0 07/14/2013   ALBUMIN 3.4 (L) 07/14/2013   CALCIUM 8.6 07/14/2013   No results found for: CHOL No results found for: HDL No results found for: LDLCALC No results found for: TRIG No results found for: CHOLHDL No results found for: WPYK9X    Assessment & Plan:   Problem List Items Addressed This Visit      Respiratory   Allergic rhinitis due to allergen - Primary    Continue current meds        Other   Chronic pain syndrome    Continue meds as directed      Relevant Orders   CBC with Differential/Platelet   Comprehensive metabolic panel   TSH   Need for tetanus, diphtheria, and acellular pertussis (Tdap) vaccine    tdap given      Relevant Orders   Tdap vaccine greater than or equal to 7yo IM (Completed)   Anxiety    Continue meds      Vitamin D insufficiency    labwork pending      Relevant Orders   VITAMIN D 25 Hydroxy (Vit-D Deficiency, Fractures)    Other Visit Diagnoses    Mixed hyperlipidemia       Relevant  Orders   Lipid panel      No orders of the defined types were placed in this encounter.   Follow-up: Return in about 6 months (around 02/25/2020) for chronic follow up - also schedule for pap smear.    SARA R Acacia Latorre, PA-C

## 2019-08-25 NOTE — Assessment & Plan Note (Signed)
Continue current meds 

## 2019-08-25 NOTE — Assessment & Plan Note (Signed)
Continue meds as directed

## 2019-08-25 NOTE — Assessment & Plan Note (Signed)
tdap given

## 2019-08-25 NOTE — Assessment & Plan Note (Signed)
labwork pending 

## 2019-08-26 LAB — COMPREHENSIVE METABOLIC PANEL
ALT: 11 IU/L (ref 0–32)
AST: 12 IU/L (ref 0–40)
Albumin/Globulin Ratio: 1.8 (ref 1.2–2.2)
Albumin: 4.3 g/dL (ref 3.8–4.9)
Alkaline Phosphatase: 106 IU/L (ref 48–121)
BUN/Creatinine Ratio: 11 (ref 9–23)
BUN: 10 mg/dL (ref 6–24)
Bilirubin Total: 0.5 mg/dL (ref 0.0–1.2)
CO2: 26 mmol/L (ref 20–29)
Calcium: 9.3 mg/dL (ref 8.7–10.2)
Chloride: 100 mmol/L (ref 96–106)
Creatinine, Ser: 0.87 mg/dL (ref 0.57–1.00)
GFR calc Af Amer: 86 mL/min/{1.73_m2} (ref >59)
GFR calc non Af Amer: 75 mL/min/{1.73_m2} (ref >59)
Globulin, Total: 2.4 g/dL (ref 1.5–4.5)
Glucose: 96 mg/dL (ref 65–99)
Potassium: 4 mmol/L (ref 3.5–5.2)
Sodium: 139 mmol/L (ref 134–144)
Total Protein: 6.7 g/dL (ref 6.0–8.5)

## 2019-08-26 LAB — LIPID PANEL
Chol/HDL Ratio: 3.2 ratio (ref 0.0–4.4)
Cholesterol, Total: 188 mg/dL (ref 100–199)
HDL: 58 mg/dL (ref >39)
LDL Chol Calc (NIH): 113 mg/dL — ABNORMAL HIGH (ref 0–99)
Triglycerides: 97 mg/dL (ref 0–149)
VLDL Cholesterol Cal: 17 mg/dL (ref 5–40)

## 2019-08-26 LAB — CBC WITH DIFFERENTIAL/PLATELET
Basophils Absolute: 0 10*3/uL (ref 0.0–0.2)
Basos: 1 %
EOS (ABSOLUTE): 0.1 10*3/uL (ref 0.0–0.4)
Eos: 2 %
Hematocrit: 37.8 % (ref 34.0–46.6)
Hemoglobin: 12.3 g/dL (ref 11.1–15.9)
Immature Grans (Abs): 0 10*3/uL (ref 0.0–0.1)
Immature Granulocytes: 0 %
Lymphocytes Absolute: 1.3 10*3/uL (ref 0.7–3.1)
Lymphs: 33 %
MCH: 28 pg (ref 26.6–33.0)
MCHC: 32.5 g/dL (ref 31.5–35.7)
MCV: 86 fL (ref 79–97)
Monocytes Absolute: 0.3 10*3/uL (ref 0.1–0.9)
Monocytes: 8 %
Neutrophils Absolute: 2.2 10*3/uL (ref 1.4–7.0)
Neutrophils: 56 %
Platelets: 229 10*3/uL (ref 150–450)
RBC: 4.4 x10E6/uL (ref 3.77–5.28)
RDW: 12.8 % (ref 11.7–15.4)
WBC: 4 10*3/uL (ref 3.4–10.8)

## 2019-08-26 LAB — CARDIOVASCULAR RISK ASSESSMENT

## 2019-08-26 LAB — TSH: TSH: 3.85 u[IU]/mL (ref 0.450–4.500)

## 2019-08-26 LAB — VITAMIN D 25 HYDROXY (VIT D DEFICIENCY, FRACTURES): Vit D, 25-Hydroxy: 97 ng/mL (ref 30.0–100.0)

## 2019-09-30 ENCOUNTER — Encounter: Payer: Commercial Managed Care - PPO | Admitting: Physician Assistant

## 2019-10-10 ENCOUNTER — Other Ambulatory Visit: Payer: Self-pay | Admitting: Physician Assistant

## 2019-10-22 ENCOUNTER — Other Ambulatory Visit: Payer: Self-pay | Admitting: Family Medicine

## 2019-11-14 ENCOUNTER — Other Ambulatory Visit: Payer: Self-pay | Admitting: Physician Assistant

## 2019-11-22 ENCOUNTER — Other Ambulatory Visit: Payer: Self-pay | Admitting: Family Medicine

## 2019-11-22 MED ORDER — CITALOPRAM HYDROBROMIDE 20 MG PO TABS: 20.0000 mg | ORAL_TABLET | Freq: Every day | ORAL | 0 refills | Status: DC

## 2019-12-14 ENCOUNTER — Other Ambulatory Visit: Payer: Self-pay

## 2019-12-14 ENCOUNTER — Other Ambulatory Visit: Payer: Self-pay | Admitting: Family Medicine

## 2020-01-09 ENCOUNTER — Encounter: Payer: Self-pay | Admitting: Physician Assistant

## 2020-01-11 ENCOUNTER — Other Ambulatory Visit: Payer: Self-pay | Admitting: Physician Assistant

## 2020-01-12 ENCOUNTER — Other Ambulatory Visit: Payer: Self-pay

## 2020-01-12 ENCOUNTER — Encounter: Payer: Self-pay | Admitting: Nurse Practitioner

## 2020-01-12 ENCOUNTER — Ambulatory Visit: Payer: Commercial Managed Care - PPO | Admitting: Nurse Practitioner

## 2020-01-12 ENCOUNTER — Other Ambulatory Visit: Payer: Self-pay | Admitting: Nurse Practitioner

## 2020-01-12 VITALS — BP 128/78 | HR 78 | Temp 97.8°F | Ht 66.0 in | Wt 234.0 lb

## 2020-01-12 DIAGNOSIS — I10 Essential (primary) hypertension: Secondary | ICD-10-CM

## 2020-01-12 DIAGNOSIS — E559 Vitamin D deficiency, unspecified: Secondary | ICD-10-CM

## 2020-01-12 DIAGNOSIS — G894 Chronic pain syndrome: Secondary | ICD-10-CM

## 2020-01-12 DIAGNOSIS — F419 Anxiety disorder, unspecified: Secondary | ICD-10-CM

## 2020-01-12 DIAGNOSIS — J309 Allergic rhinitis, unspecified: Secondary | ICD-10-CM

## 2020-01-12 DIAGNOSIS — G4700 Insomnia, unspecified: Secondary | ICD-10-CM

## 2020-01-12 MED ORDER — TRAMADOL HCL ER 300 MG PO TB24: 300.0000 mg | ORAL_TABLET | Freq: Every day | ORAL | 0 refills | Status: DC

## 2020-01-12 NOTE — Progress Notes (Signed)
Subjective:  Patient ID: Christine [name], female    DOB: 10-07-1960  Age: 59 y.o. MRN: [id number]  Chief Complaint  Patient presents with  . Hypertension    HPI Christine [name] is a 59 year old Caucasian female present for follow-up of hypertension,arthritis, vitamin D deficiency, allergic rhinitis, and anxiety.   Hypertension Christine [name] has a history of hypertension for more than 1-year. Current treatment included Verapamil 120 mg daily. Hypertension is well-controlled with BP 128/78 today in office. She denies chest pain, headache, dizziness, or dyspnea. Christine [name] tries to consume a heart healthy diet. 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 She is not currently exercising regularly due to physical limitations related to chronic pain secondary to osteoarthritis. She is compliant with medication regimen and attending medical appointments.    Osteoarthritis Patient has a long history of osteoarthritis.She describes OA as stiffness and aching throughout her body.Her OA is currently treated with Celebrex 200 mg daily. In addition, she takes Tramadol 300 mg for chronic pain and Tramadol 100 mg for breakthrough pain. Pain is moderately well-controlled. She states she is considering Gastric Sleeve surgery next year to decrease weight to help alleviate symptoms.  Review of medical record shows DDD diagnosed by x-ray in 2012. She had maximum posterior lumbar interbody fusion with Dr Yetta Barre in 2015. 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 She subsequently developed at left popliteal DVT after surgery. She was treated with Xarelto but is no longer taking this medication.   Vitamin D Deficiency Christine [name] has a history of vitamin D deficiency for over a year. Current treatment includes vitamin D supplement 2,000U daily. She tries to consume vitamin D rich foods in her diet. Currently well-controlled. Last Vit D level on 08/25/19 was 97. She is  compliant with medication regimen and follow-up appointments. She does experience fatigue and chronic joint pain.   Allergic Rhinitis Christine [name] has history of allergic rhinitis that has been on-going for several years. Symptoms include chronic sinus congestion, rhinorrhea, and post-nasal drip. Current treatment includes Singulair 10 mg daily. Symptoms are currently well-controlled.   Anxiety Christine [name] has a history of anxiety. Current treatment includes Citalopram 20 mg daily. She states symptoms are well-controlled currently. GAD-7 score today is 6. She does have insomnia associated with her anxiety. She takes Ambien 10 mg at [facility].         Current Outpatient Medications on File Prior to Visit  Medication Sig Dispense Refill  . Apoaequorin (PREVAGEN EXTRA STRENGTH PO) Take by mouth.    Marland Kitchen BIOTIN PO Take 1 tablet by mouth daily.    . celecoxib (CELEBREX) 200 MG capsule Take 1 capsule (200 mg total) by mouth daily. 90 capsule 0  . Cholecalciferol (VITAMIN D3) 2000 UNITS TABS Take 2,000 Units by mouth daily.    . citalopram (CELEXA) 20 MG tablet Take 1 tablet (20 mg total) by mouth daily. 90 tablet 0  . docusate sodium (COLACE) 100 MG capsule Take 100-200 mg by mouth 2 (two) times daily. Take 200mg  in the morning and 100mg  at bedtime.    . montelukast (SINGULAIR) 10 MG tablet Take 10 mg by mouth daily.    . traMADol (ULTRAM) 50 MG tablet Take 100 mg by mouth every 6 (six) hours as needed (break through pain).     . traMADol (ULTRAM-ER) 300 MG 24 hr tablet TAKE ONE TABLET BY MOUTH DAILY 30 tablet 0  . verapamil (CALAN-SR) 120 MG CR tablet  Take 1 tablet (120 mg total) by mouth daily. 90 tablet 0  . zolpidem (AMBIEN) 10 MG tablet Take 1 tablet by mouth once daily at bedtime 30 tablet 5   No current facility-administered medications on file prior to visit.   Past Medical History:  Diagnosis Date  . Acute embolism and thrombosis of left popliteal vein (HCC)   . Arthritis   . Headache(784.0)     hx.migraines/takes verapamil  . Insomnia   . Sleep apnea    does not use CPAP   Past Surgical History:  Procedure Laterality Date  . BUNIONECTOMY    . CESAREAN SECTION     times 2  . JOINT REPLACEMENT    . MAXIMUM ACCESS (MAS)POSTERIOR LUMBAR INTERBODY FUSION (PLIF) 2 LEVEL N/A 07/14/2013   Procedure: FOR MAXIMUM ACCESS (MAS) POSTERIOR LUMBAR INTERBODY FUSION (PLIF) 2 LEVEL lumbar three/four and four/five;  Surgeon: Tia Alert, MD;  Location: [facility];  Service: Neurosurgery;  Laterality: N/A;    Family History  Problem Relation Age of Onset  . Atrial fibrillation Mother   . Cancer Mother   . CAD Father   . Diabetes Father   . Hypertension Father   . Cancer Father   . Cancer Maternal Aunt    Social History   Socioeconomic History  . Marital status: Married    Spouse name: Not on file  . Number of children: 2  . Years of education: Not on file  . Highest education level: Not on file  Occupational History  . Not on file  Tobacco Use  . Smoking status: Never Smoker  . Smokeless tobacco: Never Used  Vaping Use  . Vaping Use: Never used  Substance and Sexual Activity  . Alcohol use: Never  . Drug use: Never  . Sexual activity: Not on file  Other Topics Concern  . 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  HENT: Negative for congestion, ear pain, sinus pressure and sore throat.   Eyes: Negative for pain.  Respiratory: Negative for cough, chest tightness, shortness of breath and wheezing.   Cardiovascular: Negative for chest pain and palpitations.  Gastrointestinal: Negative for abdominal pain, constipation, diarrhea, nausea and vomiting.   Genitourinary: Negative for dysuria and hematuria.  Musculoskeletal: Positive for arthralgias and myalgias. Negative for back pain and joint swelling.       Chronic pain to bilateral legs  Skin: Negative for rash.  Neurological: Negative for dizziness, weakness and headaches.  Psychiatric/Behavioral: Negative for dysphoric mood. The patient is not nervous/anxious.      Objective:  BP 128/78 (BP Location: Left Arm, Patient Position: Sitting)   Pulse 78   Temp 97.8 F (36.6 C) (Temporal)   Ht 5\' 6"  (1.676 m)   Wt 234 lb (106.1 kg)   SpO2 97%   BMI 37.77 kg/m   BP/Weight 01/12/2020 08/25/2019 03/02/2019  Systolic BP 128 142 108  Diastolic BP 78 82 62  Wt. (Lbs) 234 231 236  BMI 37.77 37.28 38.09    Physical Exam Vitals reviewed.  Constitutional:      Appearance: Normal appearance. She is obese.  HENT:  Head: Normocephalic.     Right Ear: Tympanic membrane, ear canal and external ear normal.     Left Ear: Tympanic membrane, ear canal and external ear normal.     Nose: Nose normal.     Mouth/Throat:     Mouth: Mucous membranes are moist.  Cardiovascular:     Rate and Rhythm: Normal rate and regular rhythm.     Pulses: Normal pulses.     Heart sounds: Normal heart sounds.  Pulmonary:     Effort: Pulmonary effort is normal.     Breath sounds: Normal breath sounds.  Abdominal:     General: Bowel sounds are normal.     Palpations: Abdomen is soft.  Musculoskeletal:        General: Tenderness present.     Cervical back: Normal range of motion.     Comments: Chronic back and bilateral knee pain, currently treated  Skin:    General: Skin is warm and dry.     Capillary Refill: Capillary refill takes less than 2 seconds.  Neurological:     General: No focal deficit present.     Mental Status: She is alert and oriented to person, place, and time.  Psychiatric:        Mood and Affect: Mood normal.        Behavior: Behavior normal.        Thought Content: Thought content  normal.        Judgment: Judgment normal.         Lab Results  Component Value Date   WBC 4.0 08/25/2019   HGB 12.3 08/25/2019   HCT 37.8 08/25/2019   PLT 229 08/25/2019   GLUCOSE 96 08/25/2019   CHOL 188 08/25/2019   TRIG 97 08/25/2019   HDL 58 08/25/2019   LDLCALC 113 (H) 08/25/2019   ALT 11 08/25/2019   AST 12 08/25/2019   NA 139 08/25/2019   K 4.0 08/25/2019   CL 100 08/25/2019   CREATININE 0.87 08/25/2019   BUN 10 08/25/2019   CO2 26 08/25/2019   TSH 3.850 08/25/2019   INR 1.00 07/08/2013      Assessment & Plan:   1. Hypertension, unspecified type-well controlled -Continue current medication -Consume heart healthy diet -CBC With Diff/Platelet -Comprehensive metabolic panel -Lipid panel -Cardiovascular Risk Assessment  2. Vitamin D insufficiency-well controlled -Continue Vit D 2,000 U daily -Vitamin D, 25-hydroxy  3. Anxiety-Well controlled -GAD-7 score 6 in office today -Continue Citalopram 20 mg daily  4. Chronic pain syndrome-moderately well controlled -Continue Tramadol as prescribe -Continue heart healthy diet -Increase physical activity  5. Insomnia, unspecified type-well controlled -Continue Ambier 10 mg at HS  6. Allergic rhinitis, unspecified seasonality, unspecified trigger- well controlled -Continue Singulair 10 mg daily     Continue regular medications Heart healthy diet Increase physical activy   Follow-up: 67-months  An After Visit Summary was printed and given to the patient.  Janie Morning, NP [facility] [phone]

## 2020-01-12 NOTE — Patient Instructions (Signed)
Continue regular medications Heart healthy diet Increase physical activity  DASH Eating Plan DASH stands for "Dietary Approaches to Stop Hypertension." The DASH eating plan is a healthy eating plan that has been shown to reduce high blood pressure (hypertension). It may also reduce your risk for type 2 diabetes, heart disease, and stroke. The DASH eating plan may also help with weight loss. What are tips for following this plan?  General guidelines  Avoid eating more than 2,300 mg (milligrams) of salt (sodium) a day. If you have hypertension, you may need to reduce your sodium intake to 1,500 mg a day.  Limit alcohol intake to no more than 1 drink a day for nonpregnant women and 2 drinks a day for men. One drink equals 12 oz of beer, 5 oz of wine, or 1 oz of hard liquor.  Work with your health care provider to maintain a healthy body weight or to lose weight. Ask what an ideal weight is for you.  Get at least 30 minutes of exercise that causes your heart to beat faster (aerobic exercise) most days of the week. Activities may include walking, swimming, or biking.  Work with your health care provider or diet and nutrition specialist (dietitian) to adjust your eating plan to your individual calorie needs. Reading food labels   Check food labels for the amount of sodium per serving. Choose foods with less than 5 percent of the Daily Value of sodium. Generally, foods with less than 300 mg of sodium per serving fit into this eating plan.  To find whole grains, look for the word "whole" as the first word in the ingredient list. Shopping  Buy products labeled as "low-sodium" or "no salt added."  Buy fresh foods. Avoid canned foods and premade or frozen meals. Cooking  Avoid adding salt when cooking. Use salt-free seasonings or herbs instead of table salt or sea salt. Check with your health care provider or pharmacist before using salt substitutes.  Do not fry foods. Cook foods using  healthy methods such as baking, boiling, grilling, and broiling instead.  Cook with heart-healthy oils, such as olive, canola, soybean, or sunflower oil. Meal planning  Eat a balanced diet that includes: ? 5 or more servings of fruits and vegetables each day. At each meal, try to fill half of your plate with fruits and vegetables. ? Up to 6-8 servings of whole grains each day. ? Less than 6 oz of lean meat, poultry, or fish each day. A 3-oz serving of meat is about the same size as a deck of cards. One egg equals 1 oz. ? 2 servings of low-fat dairy each day. ? A serving of nuts, seeds, or beans 5 times each week. ? Heart-healthy fats. Healthy fats called Omega-3 fatty acids are found in foods such as flaxseeds and coldwater fish, like sardines, salmon, and mackerel.  Limit how much you eat of the following: ? Canned or prepackaged foods. ? Food that is high in trans fat, such as fried foods. ? Food that is high in saturated fat, such as fatty meat. ? Sweets, desserts, sugary drinks, and other foods with added sugar. ? Full-fat dairy products.  Do not salt foods before eating.  Try to eat at least 2 vegetarian meals each week.  Eat more home-cooked food and less restaurant, buffet, and fast food.  When eating at a restaurant, ask that your food be prepared with less salt or no salt, if possible. What foods are recommended? The items listed may  not be a complete list. Talk with your dietitian about what dietary choices are best for you. Grains Whole-grain or whole-wheat bread. Whole-grain or whole-wheat pasta. Brown rice. Orpah Cobb. Bulgur. Whole-grain and low-sodium cereals. Pita bread. Low-fat, low-sodium crackers. Whole-wheat flour tortillas. Vegetables Fresh or frozen vegetables (raw, steamed, roasted, or grilled). Low-sodium or reduced-sodium tomato and vegetable juice. Low-sodium or reduced-sodium tomato sauce and tomato paste. Low-sodium or reduced-sodium canned  vegetables. Fruits All fresh, dried, or frozen fruit. Canned fruit in natural juice (without added sugar). Meat and other protein foods Skinless chicken or [place]. Ground chicken or [place]. Pork with fat trimmed off. Fish and seafood. Egg whites. Dried beans, peas, or lentils. Unsalted nuts, nut butters, and seeds. Unsalted canned beans. Lean cuts of beef with fat trimmed off. Low-sodium, lean deli meat. Dairy Low-fat (1%) or fat-free (skim) milk. Fat-free, low-fat, or reduced-fat cheeses. Nonfat, low-sodium ricotta or cottage cheese. Low-fat or nonfat yogurt. Low-fat, low-sodium cheese. Fats and oils Soft margarine without trans fats. Vegetable oil. Low-fat, reduced-fat, or light mayonnaise and salad dressings (reduced-sodium). Canola, safflower, olive, soybean, and sunflower oils. Avocado. Seasoning and other foods Herbs. Spices. Seasoning mixes without salt. Unsalted popcorn and pretzels. Fat-free sweets. What foods are not recommended? The items listed may not be a complete list. Talk with your dietitian about what dietary choices are best for you. Grains Baked goods made with fat, such as croissants, muffins, or some breads. Dry pasta or rice meal packs. Vegetables Creamed or fried vegetables. Vegetables in a cheese sauce. Regular canned vegetables (not low-sodium or reduced-sodium). Regular canned tomato sauce and paste (not low-sodium or reduced-sodium). Regular tomato and vegetable juice (not low-sodium or reduced-sodium). Rosita Fire. Olives. Fruits Canned fruit in a light or heavy syrup. Fried fruit. Fruit in cream or butter sauce. Meat and other protein foods Fatty cuts of meat. Ribs. Fried meat. Tomasa Blase. Sausage. Bologna and other processed lunch meats. Salami. Fatback. Hotdogs. Bratwurst. Salted nuts and seeds. Canned beans with added salt. Canned or smoked fish. Whole eggs or egg yolks. Chicken or [place] with skin. Dairy Whole or 2% milk, cream, and half-and-half. Whole or full-fat  cream cheese. Whole-fat or sweetened yogurt. Full-fat cheese. Nondairy creamers. Whipped toppings. Processed cheese and cheese spreads. Fats and oils Butter. Stick margarine. Lard. Shortening. Ghee. Bacon fat. Tropical oils, such as coconut, palm kernel, or palm oil. Seasoning and other foods Salted popcorn and pretzels. Onion salt, garlic salt, seasoned salt, table salt, and sea salt. Worcestershire sauce. Tartar sauce. Barbecue sauce. Teriyaki sauce. Soy sauce, including reduced-sodium. Steak sauce. Canned and packaged gravies. Fish sauce. Oyster sauce. Cocktail sauce. Horseradish that you find on the shelf. Ketchup. Mustard. Meat flavorings and tenderizers. Bouillon cubes. Hot sauce and Tabasco sauce. Premade or packaged marinades. Premade or packaged taco seasonings. Relishes. Regular salad dressings. Where to find more information:  National Heart, Lung, and Blood Institute: PopSteam.is  American Heart Association: www.heart.org Summary  The DASH eating plan is a healthy eating plan that has been shown to reduce high blood pressure (hypertension). It may also reduce your risk for type 2 diabetes, heart disease, and stroke.  With the DASH eating plan, you should limit salt (sodium) intake to 2,300 mg a day. If you have hypertension, you may need to reduce your sodium intake to 1,500 mg a day.  When on the DASH eating plan, aim to eat more fresh fruits and vegetables, whole grains, lean proteins, low-fat dairy, and heart-healthy fats.  Work with your health care provider or diet and  nutrition specialist (dietitian) to adjust your eating plan to your individual calorie needs. This information is not intended to replace advice given to you by your health care provider. Make sure you discuss any questions you have with your health care provider. Document Revised: 12/26/2016 Document Reviewed: 01/07/2016 Elsevier Patient Education  2020 [employer] Hypertension, Adult Hypertension is  another name for high blood pressure. High blood pressure forces your heart to work harder to pump blood. This can cause problems over time. There are two numbers in a blood pressure reading. There is a top number (systolic) over a bottom number (diastolic). It is best to have a blood pressure that is below 120/80. Healthy choices can help lower your blood pressure, or you may need medicine to help lower it. What are the causes? The cause of this condition is not known. Some conditions may be related to high blood pressure. What increases the risk?  Smoking.  Having type 2 diabetes mellitus, high cholesterol, or both.  Not getting enough exercise or physical activity.  Being overweight.  Having too much fat, sugar, calories, or salt (sodium) in your diet.  Drinking too much alcohol.  Having long-term (chronic) kidney disease.  Having a family history of high blood pressure.  Age. Risk increases with age.  Race. You may be at higher risk if you are African American.  Gender. Men are at higher risk than women before age 57. After age 57, women are at higher risk than men.  Having obstructive sleep apnea.  Stress. What are the signs or symptoms?  High blood pressure may not cause symptoms. Very high blood pressure (hypertensive crisis) may cause: ? Headache. ? Feelings of worry or nervousness (anxiety). ? Shortness of breath. ? Nosebleed. ? A feeling of being sick to your stomach (nausea). ? Throwing up (vomiting). ? Changes in how you see. ? Very bad chest pain. ? Seizures. How is this treated?  This condition is treated by making healthy lifestyle changes, such as: ? Eating healthy foods. ? Exercising more. ? Drinking less alcohol.  Your health care provider may prescribe medicine if lifestyle changes are not enough to get your blood pressure under control, and if: ? Your top number is above 130. ? Your bottom number is above 80.  Your personal target blood  pressure may vary. Follow these instructions at home: Eating and drinking   If told, follow the DASH eating plan. To follow this plan: ? Fill one half of your plate at each meal with fruits and vegetables. ? Fill one fourth of your plate at each meal with whole grains. Whole grains include whole-wheat pasta, brown rice, and whole-grain bread. ? Eat or drink low-fat dairy products, such as skim milk or low-fat yogurt. ? Fill one fourth of your plate at each meal with low-fat (lean) proteins. Low-fat proteins include fish, chicken without skin, eggs, beans, and tofu. ? Avoid fatty meat, cured and processed meat, or chicken with skin. ? Avoid pre-made or processed food.  Eat less than 1,500 mg of salt each day.  Do not drink alcohol if: ? Your doctor tells you not to drink. ? You are pregnant, may be pregnant, or are planning to become pregnant.  If you drink alcohol: ? Limit how much you use to:  0-1 drink a day for women.  0-2 drinks a day for men. ? Be aware of how much alcohol is in your drink. In the [place] one drink equals one 12 oz bottle of beer (  355 mL), one 5 oz glass of wine (148 mL), or one 1 oz glass of hard liquor (44 mL). Lifestyle   Work with your doctor to stay at a healthy weight or to lose weight. Ask your doctor what the best weight is for you.  Get at least 30 minutes of exercise most days of the week. This may include walking, swimming, or biking.  Get at least 30 minutes of exercise that strengthens your muscles (resistance exercise) at least 3 days a week. This may include lifting weights or doing Pilates.  Do not use any products that contain nicotine or tobacco, such as cigarettes, e-cigarettes, and chewing tobacco. If you need help quitting, ask your doctor.  Check your blood pressure at home as told by your doctor.  Keep all follow-up visits as told by your doctor. This is important. Medicines  Take over-the-counter and prescription medicines only as  told by your doctor. Follow directions carefully.  Do not skip doses of blood pressure medicine. The medicine does not work as well if you skip doses. Skipping doses also puts you at risk for problems.  Ask your doctor about side effects or reactions to medicines that you should watch for. Contact a doctor if you:  Think you are having a reaction to the medicine you are taking.  Have headaches that keep coming back (recurring).  Feel dizzy.  Have swelling in your ankles.  Have trouble with your vision. Get help right away if you:  Get a very bad headache.  Start to feel mixed up (confused).  Feel weak or numb.  Feel faint.  Have very bad pain in your: ? Chest. ? Belly (abdomen).  Throw up more than once.  Have trouble breathing. Summary  Hypertension is another name for high blood pressure.  High blood pressure forces your heart to work harder to pump blood.  For most people, a normal blood pressure is less than 120/80.  Making healthy choices can help lower blood pressure. If your blood pressure does not get lower with healthy choices, you may need to take medicine. This information is not intended to replace advice given to you by your health care provider. Make sure you discuss any questions you have with your health care provider. Document Revised: 09/23/2017 Document Reviewed: 09/23/2017 Elsevier Patient Education  2020 [employer]vier Inc. High Cholesterol  High cholesterol is a condition in which the blood has high levels of a white, waxy, fat-like substance (cholesterol). The human body needs small amounts of cholesterol. The liver makes all the cholesterol that the body needs. Extra (excess) cholesterol comes from the food that we eat. Cholesterol is carried from the liver by the blood through the blood vessels. If you have high cholesterol, deposits (plaques) may build up on the walls of your blood vessels (arteries). Plaques make the arteries narrower and stiffer.  Cholesterol plaques increase your risk for heart attack and stroke. Work with your health care provider to keep your cholesterol levels in a healthy range. What increases the risk? This condition is more likely to develop in people who:  Eat foods that are high in animal fat (saturated fat) or cholesterol.  Are overweight.  Are not getting enough exercise.  Have a family history of high cholesterol. What are the signs or symptoms? There are no symptoms of this condition. How is this diagnosed? This condition may be diagnosed from the results of a blood test.  If you are older than age 29, your health care provider  may check your cholesterol every 4-6 years.  You may be checked more often if you already have high cholesterol or other risk factors for heart disease. The blood test for cholesterol measures:  "Bad" cholesterol (LDL cholesterol). This is the main type of cholesterol that causes heart disease. The desired level for LDL is less than 100.  "Good" cholesterol (HDL cholesterol). This type helps to protect against heart disease by cleaning the arteries and carrying the LDL away. The desired level for HDL is 60 or higher.  Triglycerides. These are fats that the body can store or burn for energy. The desired number for triglycerides is lower than 150.  Total cholesterol. This is a measure of the total amount of cholesterol in your blood, including LDL cholesterol, HDL cholesterol, and triglycerides. A healthy number is less than 200. How is this treated? This condition is treated with diet changes, lifestyle changes, and medicines. Diet changes  This may include eating more whole grains, fruits, vegetables, nuts, and fish.  This may also include cutting back on red meat and foods that have a lot of added sugar. Lifestyle changes  Changes may include getting at least 40 minutes of aerobic exercise 3 times a week. Aerobic exercises include walking, biking, and swimming. Aerobic  exercise along with a healthy diet can help you maintain a healthy weight.  Changes may also include quitting smoking. Medicines  Medicines are usually given if diet and lifestyle changes have failed to reduce your cholesterol to healthy levels.  Your health care provider may prescribe a statin medicine. Statin medicines have been shown to reduce cholesterol, which can reduce the risk of heart disease. Follow these instructions at home: Eating and drinking If told by your health care provider:  Eat chicken (without skin), fish, veal, shellfish, ground [place] breast, and round or loin cuts of red meat.  Do not eat fried foods or fatty meats, such as hot dogs and salami.  Eat plenty of fruits, such as apples.  Eat plenty of vegetables, such as broccoli, potatoes, and carrots.  Eat beans, peas, and lentils.  Eat grains such as barley, rice, couscous, and bulgur wheat.  Eat pasta without cream sauces.  Use skim or nonfat milk, and eat low-fat or nonfat yogurt and cheeses.  Do not eat or drink whole milk, cream, ice cream, egg yolks, or hard cheeses.  Do not eat stick margarine or tub margarines that contain trans fats (also called partially hydrogenated oils).  Do not eat saturated tropical oils, such as coconut oil and palm oil.  Do not eat cakes, cookies, crackers, or other baked goods that contain trans fats.  General instructions  Exercise as directed by your health care provider. Increase your activity level with activities such as gardening, walking, and taking the stairs.  Take over-the-counter and prescription medicines only as told by your health care provider.  Do not use any products that contain nicotine or tobacco, such as cigarettes and e-cigarettes. If you need help quitting, ask your health care provider.  Keep all follow-up visits as told by your health care provider. This is important. Contact a health care provider if:  You are struggling to maintain a  healthy diet or weight.  You need help to start on an exercise program.  You need help to stop smoking. Get help right away if:  You have chest pain.  You have trouble breathing. This information is not intended to replace advice given to you by your health care provider. Make  sure you discuss any questions you have with your health care provider. Document Revised: 01/16/2017 Document Reviewed: 07/14/2015 Elsevier Patient Education  2020 [employer].

## 2020-01-13 ENCOUNTER — Other Ambulatory Visit: Payer: Commercial Managed Care - PPO

## 2020-01-13 ENCOUNTER — Other Ambulatory Visit: Payer: Self-pay | Admitting: Family Medicine

## 2020-01-14 LAB — COMPREHENSIVE METABOLIC PANEL
ALT: 10 IU/L (ref 0–32)
AST: 11 IU/L (ref 0–40)
Albumin/Globulin Ratio: 1.8 (ref 1.2–2.2)
Albumin: 4.2 g/dL (ref 3.8–4.9)
Alkaline Phosphatase: 102 IU/L (ref 44–121)
BUN/Creatinine Ratio: 10 (ref 9–23)
BUN: 9 mg/dL (ref 6–24)
Bilirubin Total: 0.4 mg/dL (ref 0.0–1.2)
CO2: 24 mmol/L (ref 20–29)
Calcium: 9.3 mg/dL (ref 8.7–10.2)
Chloride: 103 mmol/L (ref 96–106)
Creatinine, Ser: 0.87 mg/dL (ref 0.57–1.00)
GFR calc Af Amer: 86 mL/min/{1.73_m2} (ref >59)
GFR calc non Af Amer: 74 mL/min/{1.73_m2} (ref >59)
Globulin, Total: 2.3 g/dL (ref 1.5–4.5)
Glucose: 98 mg/dL (ref 65–99)
Potassium: 4.4 mmol/L (ref 3.5–5.2)
Sodium: 140 mmol/L (ref 134–144)
Total Protein: 6.5 g/dL (ref 6.0–8.5)

## 2020-01-14 LAB — CBC WITH DIFF/PLATELET
Basophils Absolute: 0 10*3/uL (ref 0.0–0.2)
Basos: 1 %
EOS (ABSOLUTE): 0.1 10*3/uL (ref 0.0–0.4)
Eos: 2 %
Hematocrit: 39.1 % (ref 34.0–46.6)
Hemoglobin: 12.7 g/dL (ref 11.1–15.9)
Immature Grans (Abs): 0 10*3/uL (ref 0.0–0.1)
Immature Granulocytes: 0 %
Lymphocytes Absolute: 1.5 10*3/uL (ref 0.7–3.1)
Lymphs: 38 %
MCH: 28.2 pg (ref 26.6–33.0)
MCHC: 32.5 g/dL (ref 31.5–35.7)
MCV: 87 fL (ref 79–97)
Monocytes Absolute: 0.3 10*3/uL (ref 0.1–0.9)
Monocytes: 8 %
Neutrophils Absolute: 2.1 10*3/uL (ref 1.4–7.0)
Neutrophils: 51 %
Platelets: 210 10*3/uL (ref 150–450)
RBC: 4.51 x10E6/uL (ref 3.77–5.28)
RDW: 12.8 % (ref 11.7–15.4)
WBC: 4.1 10*3/uL (ref 3.4–10.8)

## 2020-01-14 LAB — CARDIOVASCULAR RISK ASSESSMENT

## 2020-01-14 LAB — LIPID PANEL
Chol/HDL Ratio: 3 ratio (ref 0.0–4.4)
Cholesterol, Total: 199 mg/dL (ref 100–199)
HDL: 66 mg/dL (ref >39)
LDL Chol Calc (NIH): 117 mg/dL — ABNORMAL HIGH (ref 0–99)
Triglycerides: 89 mg/dL (ref 0–149)
VLDL Cholesterol Cal: 16 mg/dL (ref 5–40)

## 2020-01-14 LAB — VITAMIN D 25 HYDROXY (VIT D DEFICIENCY, FRACTURES): Vit D, 25-Hydroxy: 98.5 ng/mL (ref 30.0–100.0)

## 2020-01-15 ENCOUNTER — Other Ambulatory Visit: Payer: Self-pay | Admitting: Family Medicine

## 2020-02-10 ENCOUNTER — Other Ambulatory Visit: Payer: Self-pay | Admitting: Family Medicine

## 2020-02-10 ENCOUNTER — Other Ambulatory Visit: Payer: Self-pay

## 2020-02-10 DIAGNOSIS — G894 Chronic pain syndrome: Secondary | ICD-10-CM

## 2020-02-10 MED ORDER — TRAMADOL HCL ER 300 MG PO TB24: 300.0000 mg | ORAL_TABLET | Freq: Every day | ORAL | 0 refills | Status: DC

## 2020-02-15 ENCOUNTER — Other Ambulatory Visit: Payer: Self-pay

## 2020-02-15 DIAGNOSIS — G894 Chronic pain syndrome: Secondary | ICD-10-CM

## 2020-03-12 ENCOUNTER — Other Ambulatory Visit: Payer: Self-pay | Admitting: Family Medicine

## 2020-03-12 DIAGNOSIS — G894 Chronic pain syndrome: Secondary | ICD-10-CM

## 2020-03-19 ENCOUNTER — Other Ambulatory Visit: Payer: Self-pay | Admitting: Family Medicine

## 2020-04-12 ENCOUNTER — Other Ambulatory Visit: Payer: Self-pay | Admitting: Family Medicine

## 2020-04-12 DIAGNOSIS — G894 Chronic pain syndrome: Secondary | ICD-10-CM

## 2020-05-05 ENCOUNTER — Other Ambulatory Visit: Payer: Self-pay | Admitting: Family Medicine

## 2020-05-05 DIAGNOSIS — G894 Chronic pain syndrome: Secondary | ICD-10-CM

## 2020-05-08 ENCOUNTER — Other Ambulatory Visit: Payer: Self-pay

## 2020-05-08 DIAGNOSIS — G894 Chronic pain syndrome: Secondary | ICD-10-CM

## 2020-05-08 NOTE — Telephone Encounter (Signed)
Pt called and states [facility] does not have 300 mg tramadol. Requested it be sent to different location. [facility].   Terrill Mohr 05/08/20 4:17 PM

## 2020-05-09 ENCOUNTER — Other Ambulatory Visit: Payer: Self-pay | Admitting: Physician Assistant

## 2020-05-09 MED ORDER — TRAMADOL HCL ER 300 MG PO TB24: 300.0000 mg | ORAL_TABLET | Freq: Every day | ORAL | 2 refills | Status: DC

## 2020-05-30 ENCOUNTER — Other Ambulatory Visit: Payer: Self-pay | Admitting: Family Medicine

## 2020-06-11 ENCOUNTER — Other Ambulatory Visit: Payer: Self-pay | Admitting: Physician Assistant

## 2020-06-28 ENCOUNTER — Other Ambulatory Visit: Payer: Self-pay | Admitting: Family Medicine

## 2020-07-12 ENCOUNTER — Other Ambulatory Visit: Payer: Self-pay | Admitting: Family Medicine

## 2020-07-19 ENCOUNTER — Other Ambulatory Visit: Payer: Self-pay

## 2020-07-19 ENCOUNTER — Ambulatory Visit: Payer: Commercial Managed Care - PPO | Admitting: Physician Assistant

## 2020-07-19 ENCOUNTER — Encounter: Payer: Self-pay | Admitting: Physician Assistant

## 2020-07-19 VITALS — BP 126/76 | HR 85 | Temp 97.4°F | Ht 66.0 in | Wt 232.0 lb

## 2020-07-19 DIAGNOSIS — F419 Anxiety disorder, unspecified: Secondary | ICD-10-CM | POA: Diagnosis not present

## 2020-07-19 DIAGNOSIS — E559 Vitamin D deficiency, unspecified: Secondary | ICD-10-CM | POA: Diagnosis not present

## 2020-07-19 DIAGNOSIS — I10 Essential (primary) hypertension: Secondary | ICD-10-CM

## 2020-07-19 DIAGNOSIS — G894 Chronic pain syndrome: Secondary | ICD-10-CM

## 2020-07-19 DIAGNOSIS — G4733 Obstructive sleep apnea (adult) (pediatric): Secondary | ICD-10-CM

## 2020-07-19 MED ORDER — CYCLOBENZAPRINE HCL 5 MG PO TABS: 5.0000 mg | ORAL_TABLET | Freq: Every day | ORAL | 1 refills | Status: DC

## 2020-07-19 NOTE — Progress Notes (Signed)
Subjective:  Patient ID: Christine [name], female    DOB: 09/06/1960  Age: 60 y.o. MRN: [id number]  Chief Complaint  Patient presents with   Hypertension    HPI Christine [name] is a 60 year old Caucasian female present for follow-up of hypertension,arthritis, vitamin D deficiency, allergic rhinitis, and anxiety.   Hypertension Christine [name] has a history of hypertension for more than 1-year. Current treatment included Verapamil 120 mg daily. Hypertension is well-controlled with BP 126/76 today in office. She denies chest pain, headache, dizziness, or dyspnea. Christine [name] tries to consume a heart healthy diet. 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 She is not currently exercising regularly due to physical limitations related to chronic pain secondary to osteoarthritis. She is compliant with medication regimen and attending medical appointments.    Osteoarthritis Patient has a long history of osteoarthritis.She describes OA as stiffness and aching throughout her body.Her OA is currently treated with Celebrex 200 mg daily. In addition, she takes Tramadol 300 mg for chronic pain and Tramadol 50 mg for breakthrough pain. Pain is moderately well-controlled.   Review of medical record shows DDD diagnosed by x-ray in 2012. She had maximum posterior lumbar interbody fusion with Dr Yetta Barre in 2015. 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   Vitamin D Deficiency Christine [name] has a history of vitamin D deficiency for over a year. Current treatment includes vitamin D supplement 2,000U daily.   Allergic Rhinitis Christine [name] has history of allergic rhinitis that has been on-going for several years. Symptoms include chronic sinus congestion, rhinorrhea, and post-nasal drip. Current treatment includes Singulair 10 mg daily. Symptoms are currently well-controlled.   Anxiety Christine [name] has a history of anxiety. Current treatment includes Citalopram 20 mg daily. She  states that she feels she is having breakthrough symptoms of feeling blue and decreased motivation - She does have insomnia associated with her anxiety. She takes Ambien 10 mg at [facility].   Sleep apnea Pt states she was diagnosed 11-12 years ago with obstructive sleep apnea but was not able to wear the cpap = states she is snoring at night and having daytime somnolence - agreeable to repeat sleep study        Current Outpatient Medications on File Prior to Visit  Medication Sig Dispense Refill   Apoaequorin (PREVAGEN EXTRA STRENGTH PO) Take by mouth.     BIOTIN PO Take 1 tablet by mouth daily.     celecoxib (CELEBREX) 200 MG capsule Take 1 capsule (200 mg total) by mouth daily. 90 capsule 0   Cholecalciferol (VITAMIN D3) 2000 UNITS TABS Take 2,000 Units by mouth daily.     citalopram (CELEXA) 20 MG tablet Take 1 tablet (20 mg total) by mouth daily. 90 tablet 1   montelukast (SINGULAIR) 10 MG tablet TAKE ONE TABLET BY MOUTH EVERY DAY 90 tablet 3   traMADol (ULTRAM) 50 MG tablet Take 100 mg by mouth every 6 (six) hours as needed (break through pain).      traMADol (ULTRAM-ER) 300 MG 24 hr tablet Take 1 tablet (300 mg total) by mouth daily. 30 tablet 2   verapamil (CALAN-SR) 120 MG CR tablet TAKE ONE TABLET BY MOUTH DAILY 90 tablet 0   zolpidem (AMBIEN) 10 MG tablet Take 1 tablet by mouth once daily at bedtime 30 tablet 1   No current facility-administered medications on file prior to visit.   Past Medical History:  Diagnosis Date   Acute embolism and  thrombosis of left popliteal vein (HCC)    Arthritis    Headache(784.0)    hx.migraines/takes verapamil   Insomnia    Sleep apnea    does not use CPAP   Past Surgical History:  Procedure Laterality Date   BUNIONECTOMY     CESAREAN SECTION     times 2   JOINT REPLACEMENT     MAXIMUM ACCESS (MAS)POSTERIOR LUMBAR INTERBODY FUSION (PLIF) 2 LEVEL N/A 07/14/2013   Procedure: FOR MAXIMUM ACCESS (MAS) POSTERIOR LUMBAR INTERBODY FUSION (PLIF)  2 LEVEL lumbar three/four and four/five;  Surgeon: Tia Alert, MD;  Location: [facility];  Service: Neurosurgery;  Laterality: N/A;    Family History  Problem Relation Age of Onset   Atrial fibrillation Mother    Cancer Mother    CAD Father    Diabetes Father    Hypertension Father    Cancer Father    Cancer Maternal Aunt    Social History   Socioeconomic History   Marital status: Married    Spouse name: Not on file   Number of children: 2   Years of education: Not on file   Highest education level: Not on file  Occupational History   Not on file  Tobacco Use   Smoking status: Never   Smokeless tobacco: Never  Vaping Use   Vaping Use: Never used  Substance and Sexual Activity   Alcohol use: Never   Drug use: Never   Sexual activity: Not on file  Other Topics Concern   Not on file  Social History Narrative   RN at [employer]   Social Determinants of Health   Financial Resource Strain: Not on file  Food Insecurity: Not on file  Transportation Needs: Not on file  Physical Activity: Not on file  Stress: Not on file  Social Connections: Not on file    CONSTITUTIONAL: see HPI E/N/T: Negative for ear pain, nasal congestion and sore throat.  CARDIOVASCULAR: Negative for chest pain, dizziness, palpitations and pedal edema.  RESPIRATORY: Negative for recent cough and dyspnea.  GASTROINTESTINAL: Negative for abdominal pain, acid reflux symptoms, constipation, diarrhea, nausea and vomiting.  MSK: Negative for arthralgias and myalgias.  INTEGUMENTARY: Negative for rash.  NEUROLOGICAL: Negative for dizziness and headaches.  PSYCHIATRIC: see HPI    Objective:  BP 126/76 (BP Location: Right Arm, Patient Position: Sitting, Cuff Size: Normal)   Pulse 85   Temp (!) 97.4 F (36.3 C) (Temporal)   Ht 5\' 6"  (1.676 m)   Wt 232 lb (105.2 kg)   SpO2 96%   BMI 37.45 kg/m   BP/Weight 07/19/2020 01/12/2020 08/25/2019  Systolic BP 126 128 142  Diastolic BP 76 78 82   Wt. (Lbs) 232 234 231  BMI 37.45 37.77 37.28        PHYSICAL EXAM:   VS: BP 126/76 (BP Location: Right Arm, Patient Position: Sitting, Cuff Size: Normal)   Pulse 85   Temp (!) 97.4 F (36.3 C) (Temporal)   Ht 5\' 6"  (1.676 m)   Wt 232 lb (105.2 kg)   SpO2 96%   BMI 37.45 kg/m   GEN: Well nourished, well developed, in no acute distress  Cardiac: RRR; no murmurs, rubs, or gallops, Respiratory:  normal respiratory rate and pattern with no distress - normal breath sounds with no rales, rhonchi, wheezes or rubs GI: normal bowel sounds, no masses or tenderness MS: no deformity or atrophy  Skin: warm and dry, no rash   Psych: euthymic mood, appropriate affect  and demeanor  Lab Results  Component Value Date   WBC 4.1 01/13/2020   HGB 12.7 01/13/2020   HCT 39.1 01/13/2020   PLT 210 01/13/2020   GLUCOSE 98 01/13/2020   CHOL 199 01/13/2020   TRIG 89 01/13/2020   HDL 66 01/13/2020   LDLCALC 117 (H) 01/13/2020   ALT 10 01/13/2020   AST 11 01/13/2020   NA 140 01/13/2020   K 4.4 01/13/2020   CL 103 01/13/2020   CREATININE 0.87 01/13/2020   BUN 9 01/13/2020   CO2 24 01/13/2020   TSH 3.850 08/25/2019   INR 1.00 07/08/2013      Assessment & Plan:   1. Hypertension, unspecified type-well controlled -Continue current medication -Consume heart healthy diet -CBC With Diff/Platelet -Comprehensive metabolic panel -Lipid panel   2. Vitamin D insufficiency-well controlled -Continue Vit D 2,000 U daily -Vitamin D, 25-hydroxy  3. Anxiety- -Continue Citalopram 20 mg daily  4. Chronic pain syndrome-moderately well controlled -Continue Tramadol as prescribe -Continue heart healthy diet -Increase physical activity  5. Insomnia, unspecified type-well controlled -Continue Ambien 10 mg at [facility]  6. Allergic rhinitis, unspecified seasonality, unspecified trigger- well controlled -Continue Singulair 10 mg daily  7. Sleep apnea Schedule for home sleep study   Continue  regular medications Heart healthy diet Increase physical activy   Follow-up: 66-months  An After Visit Summary was printed and given to the patient.  Jettie Pagan [facility] [phone]

## 2020-07-20 LAB — CBC WITH DIFFERENTIAL/PLATELET
Basophils Absolute: 0 10*3/uL (ref 0.0–0.2)
Basos: 1 %
EOS (ABSOLUTE): 0.1 10*3/uL (ref 0.0–0.4)
Eos: 2 %
Hematocrit: 38.7 % (ref 34.0–46.6)
Hemoglobin: 12.5 g/dL (ref 11.1–15.9)
Immature Grans (Abs): 0 10*3/uL (ref 0.0–0.1)
Immature Granulocytes: 0 %
Lymphocytes Absolute: 1.3 10*3/uL (ref 0.7–3.1)
Lymphs: 31 %
MCH: 28.2 pg (ref 26.6–33.0)
MCHC: 32.3 g/dL (ref 31.5–35.7)
MCV: 87 fL (ref 79–97)
Monocytes Absolute: 0.3 10*3/uL (ref 0.1–0.9)
Monocytes: 7 %
Neutrophils Absolute: 2.4 10*3/uL (ref 1.4–7.0)
Neutrophils: 59 %
Platelets: 222 10*3/uL (ref 150–450)
RBC: 4.43 x10E6/uL (ref 3.77–5.28)
RDW: 13.1 % (ref 11.7–15.4)
WBC: 4.1 10*3/uL (ref 3.4–10.8)

## 2020-07-20 LAB — COMPREHENSIVE METABOLIC PANEL
ALT: 8 IU/L (ref 0–32)
AST: 11 IU/L (ref 0–40)
Albumin/Globulin Ratio: 1.8 (ref 1.2–2.2)
Albumin: 4.2 g/dL (ref 3.8–4.9)
Alkaline Phosphatase: 103 IU/L (ref 44–121)
BUN/Creatinine Ratio: 15 (ref 9–23)
BUN: 11 mg/dL (ref 6–24)
Bilirubin Total: 0.4 mg/dL (ref 0.0–1.2)
CO2: 24 mmol/L (ref 20–29)
Calcium: 9.5 mg/dL (ref 8.7–10.2)
Chloride: 101 mmol/L (ref 96–106)
Creatinine, Ser: 0.73 mg/dL (ref 0.57–1.00)
Globulin, Total: 2.3 g/dL (ref 1.5–4.5)
Glucose: 96 mg/dL (ref 65–99)
Potassium: 4.3 mmol/L (ref 3.5–5.2)
Sodium: 139 mmol/L (ref 134–144)
Total Protein: 6.5 g/dL (ref 6.0–8.5)
eGFR: 96 mL/min/{1.73_m2} (ref >59)

## 2020-07-20 LAB — LIPID PANEL
Chol/HDL Ratio: 3.3 ratio (ref 0.0–4.4)
Cholesterol, Total: 186 mg/dL (ref 100–199)
HDL: 57 mg/dL (ref >39)
LDL Chol Calc (NIH): 110 mg/dL — ABNORMAL HIGH (ref 0–99)
Triglycerides: 108 mg/dL (ref 0–149)
VLDL Cholesterol Cal: 19 mg/dL (ref 5–40)

## 2020-07-20 LAB — TSH: TSH: 2.43 u[IU]/mL (ref 0.450–4.500)

## 2020-07-20 LAB — VITAMIN D 25 HYDROXY (VIT D DEFICIENCY, FRACTURES): Vit D, 25-Hydroxy: 99.1 ng/mL (ref 30.0–100.0)

## 2020-07-20 LAB — CARDIOVASCULAR RISK ASSESSMENT

## 2020-07-26 ENCOUNTER — Encounter: Payer: Self-pay | Admitting: Physician Assistant

## 2020-07-27 ENCOUNTER — Other Ambulatory Visit: Payer: Self-pay | Admitting: Physician Assistant

## 2020-07-27 MED ORDER — CITALOPRAM HYDROBROMIDE 20 MG PO TABS: ORAL_TABLET | ORAL | 1 refills | Status: DC

## 2020-07-27 NOTE — Telephone Encounter (Signed)
Recommend to increase the celexa to 20mg    1 and 1/2 tablets daily Call in 3-4 week and notify how working

## 2020-08-11 ENCOUNTER — Other Ambulatory Visit: Payer: Self-pay | Admitting: Physician Assistant

## 2020-08-11 DIAGNOSIS — G894 Chronic pain syndrome: Secondary | ICD-10-CM

## 2020-08-21 ENCOUNTER — Other Ambulatory Visit: Payer: Self-pay | Admitting: Family Medicine

## 2020-09-07 ENCOUNTER — Other Ambulatory Visit: Payer: Self-pay | Admitting: Physician Assistant

## 2020-09-17 ENCOUNTER — Other Ambulatory Visit: Payer: Self-pay

## 2020-09-17 NOTE — Telephone Encounter (Signed)
Patient Prior Authorization was approved for tramadol 300 mg 09/17/20-03/17/21. Pharmacy has been notified.

## 2020-09-20 ENCOUNTER — Other Ambulatory Visit: Payer: Self-pay

## 2020-09-20 ENCOUNTER — Telehealth: Payer: Self-pay

## 2020-09-20 ENCOUNTER — Other Ambulatory Visit: Payer: Self-pay | Admitting: Physician Assistant

## 2020-09-20 MED ORDER — CITALOPRAM HYDROBROMIDE 20 MG PO TABS: ORAL_TABLET | ORAL | 1 refills | Status: DC

## 2020-09-20 NOTE — Telephone Encounter (Signed)
Patient called wanted to know can you sent RX for Citalopram, when she was in office she was suppose to start tking 1.5 tablets/30 mg a day and was told to call office when she run out. Please sent rx to [facility].

## 2020-09-28 ENCOUNTER — Other Ambulatory Visit: Payer: Self-pay | Admitting: Physician Assistant

## 2020-10-15 ENCOUNTER — Other Ambulatory Visit: Payer: Self-pay | Admitting: Physician Assistant

## 2020-10-15 DIAGNOSIS — G894 Chronic pain syndrome: Secondary | ICD-10-CM

## 2020-10-24 ENCOUNTER — Ambulatory Visit: Payer: BC Managed Care – PPO | Admitting: Physician Assistant

## 2020-10-24 ENCOUNTER — Encounter: Payer: Self-pay | Admitting: Physician Assistant

## 2020-10-24 ENCOUNTER — Other Ambulatory Visit: Payer: Self-pay

## 2020-10-24 VITALS — BP 130/78 | HR 84 | Temp 97.8°F | Ht 66.0 in | Wt 233.0 lb

## 2020-10-24 DIAGNOSIS — F419 Anxiety disorder, unspecified: Secondary | ICD-10-CM | POA: Diagnosis not present

## 2020-10-24 DIAGNOSIS — F5101 Primary insomnia: Secondary | ICD-10-CM

## 2020-10-24 DIAGNOSIS — G894 Chronic pain syndrome: Secondary | ICD-10-CM

## 2020-10-24 DIAGNOSIS — I10 Essential (primary) hypertension: Secondary | ICD-10-CM

## 2020-10-24 DIAGNOSIS — F339 Major depressive disorder, recurrent, unspecified: Secondary | ICD-10-CM | POA: Diagnosis not present

## 2020-10-24 DIAGNOSIS — Z23 Encounter for immunization: Secondary | ICD-10-CM

## 2020-10-24 DIAGNOSIS — E559 Vitamin D deficiency, unspecified: Secondary | ICD-10-CM

## 2020-10-24 MED ORDER — VALACYCLOVIR HCL 1 G PO TABS: ORAL_TABLET | ORAL | 5 refills | Status: DC

## 2020-10-24 MED ORDER — TRAZODONE HCL 50 MG PO TABS: 50.0000 mg | ORAL_TABLET | Freq: Every evening | ORAL | 0 refills | Status: DC | PRN

## 2020-10-24 MED ORDER — CITALOPRAM HYDROBROMIDE 40 MG PO TABS: 40.0000 mg | ORAL_TABLET | Freq: Every day | ORAL | 0 refills | Status: DC

## 2020-10-24 MED ORDER — TRAMADOL HCL 50 MG PO TABS
100.0000 mg | ORAL_TABLET | Freq: Four times a day (QID) | ORAL | 0 refills | Status: DC | PRN
Start: 2020-10-24 — End: 2021-12-03

## 2020-10-24 NOTE — Progress Notes (Signed)
Subjective:  Patient ID: Christine [name], female    DOB: 1960/04/17  Age: 60 y.o. MRN: [id number]  Chief Complaint  Patient presents with   Hypertension    Hypertension  Christine [name] is a 60 year old Caucasian female present for follow-up of hypertension,arthritis, vitamin D deficiency, allergic rhinitis, and anxiety.   Hypertension Christine [name] has a history of hypertension for more than 1-year. Current treatment included Verapamil 120 mg daily. Hypertension is well-controlled with BP 130/78 today in office. She denies chest pain, headache, dizziness, or dyspnea. Christine [name] tries to consume a heart healthy diet.  She is not currently exercising regularly due to physical limitations related to chronic pain secondary to osteoarthritis. She is compliant with medication regimen and attending medical appointments.    Osteoarthritis Patient has a long history of osteoarthritis.She describes OA as stiffness and aching throughout her body.Her OA is currently treated with Celebrex 200 mg daily. In addition, she takes Tramadol 300 mg for chronic pain and Tramadol 50 mg for breakthrough pain. Pain is moderately well-controlled.   Review of medical record shows DDD diagnosed by x-ray in 2012. She had maximum posterior lumbar interbody fusion with Dr Yetta Barre in 2015. 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   Vitamin D Deficiency Christine [name] has a history of vitamin D deficiency for over a year. Current treatment includes vitamin D supplement 2,000U daily.   Allergic Rhinitis Christine [name] has history of allergic rhinitis that has been on-going for several years. Symptoms include chronic sinus congestion, rhinorrhea, and post-nasal drip. Current treatment includes Singulair 10 mg daily. Symptoms are currently well-controlled.   Anxiety Christine [name] has a history of anxiety. Current treatment includes Citalopram 30 mg daily. She states that she feels she is having breakthrough  symptoms of feeling blue and decreased motivation - She has been more sad and tearful recently - states always thinking about her mother who passed away 11 years ago - would like medication adjusted - scored 21 on PSQ-9 She is also having trouble with staying asleep and would like to stop the [place] she is on and try something else  Current Outpatient Medications on File Prior to Visit  Medication Sig Dispense Refill   Apoaequorin (PREVAGEN EXTRA STRENGTH PO) Take by mouth.     BIOTIN PO Take 1 tablet by mouth daily.     celecoxib (CELEBREX) 200 MG capsule Take 1 capsule (200 mg total) by mouth daily. 90 capsule 0   Cholecalciferol (VITAMIN D3) 2000 UNITS TABS Take 2,000 Units by mouth daily.     citalopram (CELEXA) 20 MG tablet 1 and 1/2 tablets daily 135 tablet 1   cyclobenzaprine (FLEXERIL) 5 MG tablet Take 1 tablet (5 mg total) by mouth at bedtime. 30 tablet 1   doxycycline (PERIOSTAT) 20 MG tablet Take 20 mg by mouth 2 (two) times daily.     montelukast (SINGULAIR) 10 MG tablet TAKE ONE TABLET BY MOUTH EVERY DAY 90 tablet 3   traMADol (ULTRAM) 50 MG tablet Take 100 mg by mouth every 6 (six) hours as needed (break through pain).      traMADol (ULTRAM-ER) 300 MG 24 hr tablet TAKE 1 TABLET BY MOUTH EVERY DAY 30 tablet 0   verapamil (CALAN-SR) 120 MG CR tablet TAKE ONE TABLET BY MOUTH DAILY 90 tablet 0   zolpidem (AMBIEN) 10 MG tablet Take 1 tablet by mouth once daily at bedtime 30 tablet 1   No current facility-administered medications on file prior to visit.  Past Medical History:  Diagnosis Date   Acute embolism and thrombosis of left popliteal vein (HCC)    Arthritis    Headache(784.0)    hx.migraines/takes verapamil   Insomnia    Sleep apnea    does not use CPAP   Past Surgical History:  Procedure Laterality Date   BUNIONECTOMY     CESAREAN SECTION     times 2   JOINT REPLACEMENT     MAXIMUM ACCESS (MAS)POSTERIOR LUMBAR INTERBODY FUSION (PLIF) 2 LEVEL N/A 07/14/2013    Procedure: FOR MAXIMUM ACCESS (MAS) POSTERIOR LUMBAR INTERBODY FUSION (PLIF) 2 LEVEL lumbar three/four and four/five;  Surgeon: Tia Alert, MD;  Location: [facility];  Service: Neurosurgery;  Laterality: N/A;    Family History  Problem Relation Age of Onset   Atrial fibrillation Mother    Cancer Mother    CAD Father    Diabetes Father    Hypertension Father    Cancer Father    Cancer Maternal Aunt    Social History   Socioeconomic History   Marital status: Married    Spouse name: Not on file   Number of children: 2   Years of education: Not on file   Highest education level: Not on file  Occupational History   Not on file  Tobacco Use   Smoking status: Never   Smokeless tobacco: Never  Vaping Use   Vaping Use: Never used  Substance and Sexual Activity   Alcohol use: Never   Drug use: Never   Sexual activity: Not on file  Other Topics Concern   Not on file  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  CARDIOVASCULAR: Negative for chest pain, dizziness, palpitations and pedal edema.  RESPIRATORY: Negative for recent cough and dyspnea.  GASTROINTESTINAL: Negative for abdominal pain, acid reflux symptoms, constipation, diarrhea, nausea and vomiting.  MSK: see HPI INTEGUMENTARY: Negative for rash.  NEUROLOGICAL: Negative for dizziness and headaches.  PSYCHIATRIC: see HPI   Objective:  BP 130/78 (BP Location: Left Arm, Patient Position: Sitting)   Pulse 84   Temp 97.8 F (36.6 C) (Temporal)   Ht 5\' 6"  (1.676 m)   Wt 233 lb (105.7 kg)   SpO2 98%   BMI 37.61 kg/m   BP/Weight 10/24/2020 07/19/2020 01/12/2020  Systolic BP 130 126 128   Diastolic BP 78 76 78  Wt. (Lbs) 233 232 234  BMI 37.61 37.45 37.77       PHYSICAL EXAM:   VS: BP 130/78 (BP Location: Left Arm, Patient Position: Sitting)   Pulse 84   Temp 97.8 F (36.6 C) (Temporal)   Ht 5\' 6"  (1.676 m)   Wt 233 lb (105.7 kg)   SpO2 98%   BMI 37.61 kg/m   GEN: Well nourished, well developed, in no acute distress  Cardiac: RRR; no murmurs, rubs, or gallops,no edema -  Respiratory:  normal respiratory rate and pattern with no distress - normal breath sounds with no rales, rhonchi, wheezes or rubs Skin: warm and dry, no rash  Psych: euthymic mood, appropriate affect and demeanor - tearful  Lab Results  Component Value Date   WBC 4.1 07/19/2020  HGB 12.5 07/19/2020   HCT 38.7 07/19/2020   PLT 222 07/19/2020   GLUCOSE 96 07/19/2020   CHOL 186 07/19/2020   TRIG 108 07/19/2020   HDL 57 07/19/2020   LDLCALC 110 (H) 07/19/2020   ALT 8 07/19/2020   AST 11 07/19/2020   NA 139 07/19/2020   K 4.3 07/19/2020   CL 101 07/19/2020   CREATININE 0.73 07/19/2020   BUN 11 07/19/2020   CO2 24 07/19/2020   TSH 2.430 07/19/2020   INR 1.00 07/08/2013      Assessment & Plan:   1. Hypertension, unspecified type-well controlled -Continue current medication -Consume heart healthy diet -CBC With Diff/Platelet -Comprehensive metabolic panel -Lipid panel   2. Vitamin D insufficiency-well controlled -Continue Vit D 2,000 U daily -Vitamin D, 25-hydroxy  3. Anxiety- Will increase citalopram to 40mg  qd Also recommend pt start counseling 4. Chronic pain syndrome-moderately well controlled -Continue Tramadol as prescribe -Continue heart healthy diet -Increase physical activity  5. Insomnia, unspecified type-well controlled Stop ambien Rx for trazadone 50mg  [facility]  6. Allergic rhinitis, unspecified seasonality, unspecified trigger- well controlled -Continue Singulair 10 mg daily  7. Flucelvax given  Continue regular medications Heart healthy  diet Increase physical activy   Follow-up: 3 months  An After Visit Summary was printed and given to the patient.  [facility] [phone]

## 2020-10-25 LAB — CBC WITH DIFFERENTIAL/PLATELET
Basophils Absolute: 0 10*3/uL (ref 0.0–0.2)
Basos: 1 %
EOS (ABSOLUTE): 0.1 10*3/uL (ref 0.0–0.4)
Eos: 2 %
Hematocrit: 39 % (ref 34.0–46.6)
Hemoglobin: 12.6 g/dL (ref 11.1–15.9)
Immature Grans (Abs): 0 10*3/uL (ref 0.0–0.1)
Immature Granulocytes: 0 %
Lymphocytes Absolute: 1.4 10*3/uL (ref 0.7–3.1)
Lymphs: 32 %
MCH: 28 pg (ref 26.6–33.0)
MCHC: 32.3 g/dL (ref 31.5–35.7)
MCV: 87 fL (ref 79–97)
Monocytes Absolute: 0.3 10*3/uL (ref 0.1–0.9)
Monocytes: 7 %
Neutrophils Absolute: 2.5 10*3/uL (ref 1.4–7.0)
Neutrophils: 58 %
Platelets: 239 10*3/uL (ref 150–450)
RBC: 4.5 x10E6/uL (ref 3.77–5.28)
RDW: 13.2 % (ref 11.7–15.4)
WBC: 4.4 10*3/uL (ref 3.4–10.8)

## 2020-10-25 LAB — COMPREHENSIVE METABOLIC PANEL
ALT: 8 IU/L (ref 0–32)
AST: 12 IU/L (ref 0–40)
Albumin/Globulin Ratio: 2.1 (ref 1.2–2.2)
Albumin: 4.4 g/dL (ref 3.8–4.9)
Alkaline Phosphatase: 102 IU/L (ref 44–121)
BUN/Creatinine Ratio: 11 (ref 9–23)
BUN: 10 mg/dL (ref 6–24)
Bilirubin Total: 0.3 mg/dL (ref 0.0–1.2)
CO2: 26 mmol/L (ref 20–29)
Calcium: 9.4 mg/dL (ref 8.7–10.2)
Chloride: 100 mmol/L (ref 96–106)
Creatinine, Ser: 0.88 mg/dL (ref 0.57–1.00)
Globulin, Total: 2.1 g/dL (ref 1.5–4.5)
Glucose: 96 mg/dL (ref 70–99)
Potassium: 4.4 mmol/L (ref 3.5–5.2)
Sodium: 138 mmol/L (ref 134–144)
Total Protein: 6.5 g/dL (ref 6.0–8.5)
eGFR: 77 mL/min/{1.73_m2} (ref >59)

## 2020-10-25 LAB — HEMOGLOBIN A1C
Est. average glucose Bld gHb Est-mCnc: 128 mg/dL
Hgb A1c MFr Bld: 6.1 % — ABNORMAL HIGH (ref 4.8–5.6)

## 2020-10-25 LAB — VITAMIN D 25 HYDROXY (VIT D DEFICIENCY, FRACTURES): Vit D, 25-Hydroxy: 100 ng/mL (ref 30.0–100.0)

## 2020-10-25 LAB — CARDIOVASCULAR RISK ASSESSMENT

## 2020-10-25 LAB — LIPID PANEL
Chol/HDL Ratio: 3.3 ratio (ref 0.0–4.4)
Cholesterol, Total: 195 mg/dL (ref 100–199)
HDL: 60 mg/dL (ref >39)
LDL Chol Calc (NIH): 121 mg/dL — ABNORMAL HIGH (ref 0–99)
Triglycerides: 76 mg/dL (ref 0–149)
VLDL Cholesterol Cal: 14 mg/dL (ref 5–40)

## 2020-10-25 LAB — TSH: TSH: 3.13 u[IU]/mL (ref 0.450–4.500)

## 2020-11-07 ENCOUNTER — Other Ambulatory Visit: Payer: Self-pay | Admitting: Physician Assistant

## 2020-11-10 ENCOUNTER — Other Ambulatory Visit: Payer: Self-pay | Admitting: Physician Assistant

## 2020-11-10 DIAGNOSIS — G894 Chronic pain syndrome: Secondary | ICD-10-CM

## 2020-11-19 ENCOUNTER — Other Ambulatory Visit: Payer: Self-pay | Admitting: Physician Assistant

## 2020-11-21 DIAGNOSIS — M6528 Calcific tendinitis, other site: Secondary | ICD-10-CM | POA: Diagnosis not present

## 2020-11-26 ENCOUNTER — Telehealth: Payer: Self-pay

## 2020-11-26 NOTE — Telephone Encounter (Signed)
Patient verbalized understanding, patient will call back after she calls insurance.

## 2020-11-26 NOTE — Telephone Encounter (Signed)
Patient called and stated trazodone is not helping her sleep and she wanted to go back on the zolpidem if possible.

## 2020-12-03 ENCOUNTER — Telehealth: Payer: Self-pay

## 2020-12-03 ENCOUNTER — Other Ambulatory Visit: Payer: Self-pay | Admitting: Legal Medicine

## 2020-12-03 MED ORDER — ZOLPIDEM TARTRATE 10 MG PO TABS: 10.0000 mg | ORAL_TABLET | Freq: Every evening | ORAL | 1 refills | Status: DC | PRN

## 2020-12-03 NOTE — Telephone Encounter (Signed)
Patient called and stated that Kennon Rounds had switched the patient to Trazadone for sleep due to patient being worried about side effects. Now the patient stated that the Trazadone did not work and she has given it time to work and she is only getting 1 hour of sleep and was requesting if she could go back to the Ambien because it worked for her. She was requesting if we could send it in to [facility].

## 2020-12-11 ENCOUNTER — Other Ambulatory Visit: Payer: Self-pay | Admitting: Physician Assistant

## 2020-12-12 ENCOUNTER — Ambulatory Visit: Payer: BC Managed Care – PPO | Admitting: Physician Assistant

## 2020-12-13 ENCOUNTER — Encounter: Payer: Self-pay | Admitting: Nurse Practitioner

## 2020-12-13 ENCOUNTER — Ambulatory Visit: Payer: BC Managed Care – PPO | Admitting: Nurse Practitioner

## 2020-12-13 ENCOUNTER — Ambulatory Visit: Payer: BC Managed Care – PPO | Admitting: Physician Assistant

## 2020-12-13 ENCOUNTER — Other Ambulatory Visit: Payer: Self-pay

## 2020-12-13 VITALS — BP 140/72 | HR 79 | Temp 97.5°F | Resp 16 | Ht 66.0 in | Wt 233.0 lb

## 2020-12-13 DIAGNOSIS — F5101 Primary insomnia: Secondary | ICD-10-CM | POA: Diagnosis not present

## 2020-12-13 MED ORDER — ZOLPIDEM TARTRATE 10 MG PO TABS: 10.0000 mg | ORAL_TABLET | Freq: Every day | ORAL | 2 refills | Status: DC

## 2020-12-13 NOTE — Patient Instructions (Addendum)
Resume Ambien 10 mg at bedtime for insomnia Follow-up in 22-months with PCP   Quality Sleep Information, Adult Quality sleep is important for your mental and physical health. It also improves your quality of life. Quality sleep means you: Are asleep for most of the time you are in bed. Fall asleep within 30 minutes. Wake up no more than once a night.  Are awake for no longer than 20 minutes if you do wake up during the night. Most adults need 7-8 hours of quality sleep each night. How can poor sleep affect me? If you do not get enough quality sleep, you may have: Mood swings. Daytime sleepiness. Confusion. Decreased reaction time. Sleep disorders, such as insomnia and sleep apnea. Difficulty with: Solving problems. Coping with stress. Paying attention. These issues may affect your performance and productivity at work, school, and at home. Lack of sleep may also put you at higher risk for accidents, suicide, and risky behaviors. If you do not get quality sleep you may also be at higher risk for several health problems, including: Infections. Type 2 diabetes. Heart disease. High blood pressure. Obesity. Worsening of long-term conditions, like arthritis, kidney disease, depression, Parkinson's disease, and epilepsy. What actions can I take to get more quality sleep?   Stick to a sleep schedule. Go to sleep and wake up at about the same time each day. Do not try to sleep less on weekdays and make up for lost sleep on weekends. This does not work. Try to get about 30 minutes of exercise on most days. Do not exercise 2-3 hours before going to bed. Limit naps during the day to 30 minutes or less. Do not use any products that contain nicotine or tobacco, such as cigarettes or e-cigarettes. If you need help quitting, ask your health care provider. Do not drink caffeinated beverages for at least 8 hours before going to bed. Coffee, tea, and some sodas contain caffeine. Do not drink alcohol  close to bedtime. Do not eat large meals close to bedtime. Do not take naps in the late afternoon. Try to get at least 30 minutes of sunlight every day. Morning sunlight is best. Make time to relax before bed. Reading, listening to music, or taking a hot bath promotes quality sleep. Make your bedroom a place that promotes quality sleep. Keep your bedroom dark, quiet, and at a comfortable room temperature. Make sure your bed is comfortable. Take out sleep distractions like TV, a computer, smartphone, and bright lights. If you are lying awake in bed for longer than 20 minutes, get up and do a relaxing activity until you feel sleepy. Work with your health care provider to treat medical conditions that may affect sleeping, such as: Nasal obstruction. Snoring. Sleep apnea and other sleep disorders. Talk to your health care provider if you think any of your prescription medicines may cause you to have difficulty falling or staying asleep. If you have sleep problems, talk with a sleep consultant. If you think you have a sleep disorder, talk with your health care provider about getting evaluated by a specialist. Where to find more information National Sleep Foundation website: https://sleepfoundation.org National Heart, Lung, and Blood Institute (NHLBI): https://hall.info/.pdf Centers for Disease Control and Prevention (CDC): DetailSports.is Contact a health care provider if you: Have trouble getting to sleep or staying asleep. Often wake up very early in the morning and cannot get back to sleep. Have daytime sleepiness. Have daytime sleep attacks of suddenly falling asleep and sudden muscle weakness (narcolepsy). Have  a tingling sensation in your legs with a strong urge to move your legs (restless legs syndrome). Stop breathing briefly during sleep (sleep apnea). Think you have a sleep disorder or are taking a medicine that is affecting your quality  of sleep. Summary Most adults need 7-8 hours of quality sleep each night. Getting enough quality sleep is an important part of health and well-being. Make your bedroom a place that promotes quality sleep and avoid things that may cause you to have poor sleep, such as alcohol, caffeine, smoking, and large meals. Talk to your health care provider if you have trouble falling asleep or staying asleep. This information is not intended to replace advice given to you by your health care provider. Make sure you discuss any questions you have with your health care provider. Document Revised: 04/22/2017 Document Reviewed: 04/22/2017 Elsevier Patient Education  2022 [employer].

## 2020-12-13 NOTE — Progress Notes (Signed)
Subjective:  Patient ID: Christine [name], female    DOB: [birth year]  Age: 60 y.o. MRN: [id number]  Chief Complaint  Patient presents with   Insomnia    HPI  Gurtha is a 60 year old Caucasian female that presents for evaluation of insomnia. She was previously prescribed Ambien 10 mg QHS.She had requested a change to insomnia during her last visit with her PCP on 10/24/20. Trazodone was prescribed, Ambien was stopped. States she would like to resume Ambien.   Current Outpatient Medications on File Prior to Visit  Medication Sig Dispense Refill   Apoaequorin (PREVAGEN EXTRA STRENGTH PO) Take by mouth.     BIOTIN PO Take 1 tablet by mouth daily.     celecoxib (CELEBREX) 200 MG capsule Take 1 capsule (200 mg total) by mouth daily. 90 capsule 0   Cholecalciferol (VITAMIN D3) 2000 UNITS TABS Take 2,000 Units by mouth daily.     citalopram (CELEXA) 40 MG tablet Take 1 tablet (40 mg total) by mouth daily. 90 tablet 0   cyclobenzaprine (FLEXERIL) 5 MG tablet Take 1 tablet (5 mg total) by mouth at bedtime. 30 tablet 1   doxycycline (PERIOSTAT) 20 MG tablet Take 20 mg by mouth 2 (two) times daily.     montelukast (SINGULAIR) 10 MG tablet TAKE ONE TABLET BY MOUTH EVERY DAY 90 tablet 3   traMADol (ULTRAM) 50 MG tablet Take 2 tablets (100 mg total) by mouth every 6 (six) hours as needed (break through pain). 90 tablet 0   traMADol (ULTRAM-ER) 300 MG 24 hr tablet Take 1 tablet (300 mg total) by mouth daily. 30 tablet 2   traZODone (DESYREL) 50 MG tablet Take 1 tablet (50 mg total) by mouth at bedtime as needed for sleep. 90 tablet 0   valACYclovir (VALTREX) 1000 MG tablet 2 po q 12 hours for one day for fever blisters 20 tablet 5   verapamil (CALAN-SR) 120 MG CR tablet TAKE ONE TABLET BY MOUTH DAILY 90 tablet 0   zolpidem (AMBIEN) 10 MG tablet Take 1 tablet (10 mg total) by mouth at bedtime as needed for sleep. 15 tablet 1   No current facility-administered medications on file prior to visit.    Past Medical History:  Diagnosis Date   Acute embolism and thrombosis of left popliteal vein (HCC)    Arthritis    Headache(784.0)    hx.migraines/takes verapamil   Insomnia    Sleep apnea    does not use CPAP   Past Surgical History:  Procedure Laterality Date   BUNIONECTOMY     CESAREAN SECTION     times 2   JOINT REPLACEMENT     MAXIMUM ACCESS (MAS)POSTERIOR LUMBAR INTERBODY FUSION (PLIF) 2 LEVEL N/A 07/14/2013   Procedure: FOR MAXIMUM ACCESS (MAS) POSTERIOR LUMBAR INTERBODY FUSION (PLIF) 2 LEVEL lumbar three/four and four/five;  Surgeon: Tia Alert, MD;  Location: [facility];  Service: Neurosurgery;  Laterality: N/A;    Family History  Problem Relation Age of Onset   Atrial fibrillation Mother    Cancer Mother    CAD Father    Diabetes Father    Hypertension Father    Cancer Father    Cancer Maternal Aunt    Social History   Socioeconomic History   Marital status: Married    Spouse name: Not on file   Number of children: 2   Years of education: Not on file   Highest education level: Not on file  Occupational History   Not on  file  Tobacco Use   Smoking status: Never   Smokeless tobacco: Never  Vaping Use   Vaping Use: Never used  Substance and Sexual Activity   Alcohol use: Never   Drug use: Never   Sexual activity: Not on file  Other Topics Concern   Not on file  Social History Narrative   RN at [employer]   Social Determinants of Health   Financial Resource Strain: Not on file  Food Insecurity: Not on file  Transportation Needs: Not on file  Physical Activity: Not on file  Stress: Not on file  Social Connections: Not on file    Review of Systems  Psychiatric/Behavioral:  Positive for sleep disturbance. Negative for dysphoric mood. The patient is not nervous/anxious.   All other systems reviewed and are negative.   Objective:  BP 140/72   Pulse 79   Temp (!) 97.5 F (36.4 C)   Resp 16   Ht 5\' 6"  (1.676 m)   Wt 233 lb  (105.7 kg)   SpO2 97%   BMI 37.61 kg/m    BP/Weight 10/24/2020 07/19/2020 01/12/2020  Systolic BP 130 126 128  Diastolic BP 78 76 78  Wt. (Lbs) 233 232 234  BMI 37.61 37.45 37.77    Physical Exam Vitals reviewed.  Skin:    General: Skin is warm and dry.     Capillary Refill: Capillary refill takes less than 2 seconds.  Neurological:     General: No focal deficit present.     Mental Status: She is alert and oriented to person, place, and time.  Psychiatric:        Mood and Affect: Mood normal.        Behavior: Behavior normal.       Lab Results  Component Value Date   WBC 4.4 10/24/2020   HGB 12.6 10/24/2020   HCT 39.0 10/24/2020   PLT 239 10/24/2020   GLUCOSE 96 10/24/2020   CHOL 195 10/24/2020   TRIG 76 10/24/2020   HDL 60 10/24/2020   LDLCALC 121 (H) 10/24/2020   ALT 8 10/24/2020   AST 12 10/24/2020   NA 138 10/24/2020   K 4.4 10/24/2020   CL 100 10/24/2020   CREATININE 0.88 10/24/2020   BUN 10 10/24/2020   CO2 26 10/24/2020   TSH 3.130 10/24/2020   INR 1.00 07/08/2013   HGBA1C 6.1 (H) 10/24/2020      Assessment & Plan:    .1. Primary insomnia - zolpidem (AMBIEN) 10 MG tablet; Take 1 tablet (10 mg total) by mouth at bedtime.  Dispense: 30 tablet; Refill: 2  -stop Trazodone  Resume Ambien 10 mg at bedtime for insomnia Follow-up in 65-months with PCP    Follow-up: 17-months  An After Visit Summary was printed and given to the patient.  I, 8-month, NP, have reviewed all documentation for this visit. The documentation on 12/13/20 for the exam, diagnosis, procedures, and orders are all accurate and complete.    Signed, 12/15/20, NP [facility] [phone]

## 2020-12-17 ENCOUNTER — Other Ambulatory Visit: Payer: Self-pay | Admitting: Legal Medicine

## 2020-12-17 DIAGNOSIS — F5101 Primary insomnia: Secondary | ICD-10-CM

## 2020-12-26 DIAGNOSIS — H2513 Age-related nuclear cataract, bilateral: Secondary | ICD-10-CM | POA: Diagnosis not present

## 2020-12-26 DIAGNOSIS — H5203 Hypermetropia, bilateral: Secondary | ICD-10-CM | POA: Diagnosis not present

## 2021-01-23 ENCOUNTER — Other Ambulatory Visit: Payer: Self-pay | Admitting: Physician Assistant

## 2021-01-23 DIAGNOSIS — G894 Chronic pain syndrome: Secondary | ICD-10-CM

## 2021-02-04 ENCOUNTER — Ambulatory Visit: Payer: BC Managed Care – PPO | Admitting: Physician Assistant

## 2021-02-07 ENCOUNTER — Other Ambulatory Visit: Payer: Self-pay | Admitting: Family Medicine

## 2021-02-07 DIAGNOSIS — G894 Chronic pain syndrome: Secondary | ICD-10-CM

## 2021-02-16 ENCOUNTER — Other Ambulatory Visit: Payer: Self-pay | Admitting: Family Medicine

## 2021-02-16 ENCOUNTER — Other Ambulatory Visit: Payer: Self-pay | Admitting: Physician Assistant

## 2021-02-18 ENCOUNTER — Other Ambulatory Visit: Payer: Self-pay | Admitting: Physician Assistant

## 2021-02-18 DIAGNOSIS — F339 Major depressive disorder, recurrent, unspecified: Secondary | ICD-10-CM

## 2021-02-18 MED ORDER — CITALOPRAM HYDROBROMIDE 40 MG PO TABS: 40.0000 mg | ORAL_TABLET | Freq: Every day | ORAL | 0 refills | Status: DC

## 2021-03-11 ENCOUNTER — Other Ambulatory Visit: Payer: Self-pay | Admitting: Physician Assistant

## 2021-03-12 DIAGNOSIS — M6528 Calcific tendinitis, other site: Secondary | ICD-10-CM | POA: Diagnosis not present

## 2021-03-15 ENCOUNTER — Other Ambulatory Visit: Payer: Self-pay | Admitting: Nurse Practitioner

## 2021-03-15 DIAGNOSIS — F5101 Primary insomnia: Secondary | ICD-10-CM

## 2021-03-25 ENCOUNTER — Other Ambulatory Visit: Payer: Self-pay | Admitting: Physician Assistant

## 2021-03-26 DIAGNOSIS — R2689 Other abnormalities of gait and mobility: Secondary | ICD-10-CM | POA: Diagnosis not present

## 2021-03-26 DIAGNOSIS — M6281 Muscle weakness (generalized): Secondary | ICD-10-CM | POA: Diagnosis not present

## 2021-03-26 DIAGNOSIS — M25572 Pain in left ankle and joints of left foot: Secondary | ICD-10-CM | POA: Diagnosis not present

## 2021-03-26 DIAGNOSIS — M25672 Stiffness of left ankle, not elsewhere classified: Secondary | ICD-10-CM | POA: Diagnosis not present

## 2021-04-02 ENCOUNTER — Other Ambulatory Visit: Payer: Self-pay

## 2021-04-02 ENCOUNTER — Other Ambulatory Visit: Payer: Self-pay | Admitting: Physician Assistant

## 2021-04-02 ENCOUNTER — Ambulatory Visit: Payer: BC Managed Care – PPO | Admitting: Physician Assistant

## 2021-04-02 ENCOUNTER — Encounter: Payer: Self-pay | Admitting: Physician Assistant

## 2021-04-02 VITALS — BP 132/68 | HR 78 | Temp 97.6°F | Ht 66.0 in | Wt 232.0 lb

## 2021-04-02 DIAGNOSIS — E669 Obesity, unspecified: Secondary | ICD-10-CM | POA: Diagnosis not present

## 2021-04-02 DIAGNOSIS — Z1231 Encounter for screening mammogram for malignant neoplasm of breast: Secondary | ICD-10-CM

## 2021-04-02 MED ORDER — WEGOVY 0.25 MG/0.5ML ~~LOC~~ SOAJ: 0.2500 mg | SUBCUTANEOUS | 0 refills | Status: DC

## 2021-04-02 NOTE — Progress Notes (Signed)
? ?Subjective:  ?Patient ID: Christine [name], female    DOB: May 13, 1960  Age: 61 y.o. MRN: [id number] ? ?Chief Complaint  ?Patient presents with  ? Weight loss  ? ? ?HPI ? Pt in today wanting to start medication to help with weight loss.  She states that she has changed her diet, eating better, is exercising regularly and drinking water.  In the past she has tried weight watchers, [facility] program and also tried phentermine ?Current Outpatient Medications on File Prior to Visit  ?Medication Sig Dispense Refill  ? Apoaequorin (PREVAGEN EXTRA STRENGTH PO) Take by mouth.    ? BIOTIN PO Take 1 tablet by mouth daily.    ? celecoxib (CELEBREX) 200 MG capsule Take 1 capsule (200 mg total) by mouth daily. 90 capsule 0  ? Cholecalciferol (VITAMIN D3) 2000 UNITS TABS Take 2,000 Units by mouth daily.    ? citalopram (CELEXA) 40 MG tablet Take 1 tablet (40 mg total) by mouth daily. 90 tablet 0  ? cyclobenzaprine (FLEXERIL) 5 MG tablet Take 1 tablet (5 mg total) by mouth at bedtime. 30 tablet 1  ? doxycycline (VIBRA-TABS) 100 MG tablet Take 100 mg by mouth 2 (two) times daily.    ? montelukast (SINGULAIR) 10 MG tablet TAKE ONE TABLET BY MOUTH EVERY DAY 90 tablet 3  ? traMADol (ULTRAM) 50 MG tablet Take 2 tablets (100 mg total) by mouth every 6 (six) hours as needed (break through pain). 90 tablet 0  ? traMADol (ULTRAM-ER) 300 MG 24 hr tablet TAKE 1 TABLET BY MOUTH EVERY DAY 30 tablet 2  ? valACYclovir (VALTREX) 1000 MG tablet 2 po q 12 hours for one day for fever blisters 20 tablet 5  ? verapamil (CALAN-SR) 120 MG CR tablet TAKE ONE TABLET BY MOUTH DAILY 90 tablet 0  ? zolpidem (AMBIEN) 10 MG tablet Take 1 tablet (10 mg total) by mouth at bedtime. 30 tablet 2  ? ?No current facility-administered medications on file prior to visit.  ? ?Past Medical History:  ?Diagnosis Date  ? Acute embolism and thrombosis of left popliteal vein (HCC)   ? Arthritis   ? Headache(784.0)   ? hx.migraines/takes verapamil  ? Insomnia    ? Sleep apnea   ? does not use CPAP  ? ?Past Surgical History:  ?Procedure Laterality Date  ? BUNIONECTOMY    ? CESAREAN SECTION    ? times 2  ? JOINT REPLACEMENT    ? MAXIMUM ACCESS (MAS)POSTERIOR LUMBAR INTERBODY FUSION (PLIF) 2 LEVEL N/A 07/14/2013  ? Procedure: FOR MAXIMUM ACCESS (MAS) POSTERIOR LUMBAR INTERBODY FUSION (PLIF) 2 LEVEL lumbar three/four and four/five;  Surgeon: Tia Alert, MD;  Location: [facility];  Service: Neurosurgery;  Laterality: N/A;  ?  ?Family History  ?Problem Relation Age of Onset  ? Atrial fibrillation Mother   ? Cancer Mother   ? CAD Father   ? Diabetes Father   ? Hypertension Father   ? Cancer Father   ? Cancer Maternal Aunt   ? ?Social History  ? ?Socioeconomic History  ? Marital status: Married  ?  Spouse name: Not on file  ? Number of children: 2  ? Years of education: Not on file  ? Highest education level: Not on file  ?Occupational History  ? Not on file  ?Tobacco Use  ? Smoking status: Never  ? Smokeless tobacco: Never  ?Vaping Use  ? Vaping Use: Never used  ?Substance and Sexual Activity  ? Alcohol use: Never  ?  Drug use: Never  ? Sexual activity: Not on file  ?Other Topics Concern  ? Not on file  ?Social History Narrative  ? 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 ?Social Determinants of Health  ? ?Financial Resource Strain: Not on file  ?Food Insecurity: Not on file  ?Transportation Needs: Not on file  ?Physical Activity: Not on file  ?Stress: Not on file  ?Social Connections: Not on file  ? ? ?Review of Systems ?CONSTITUTIONAL: Negative for chills, fatigue, fever, unintentional weight gain and unintentional weight loss.  ?CARDIOVASCULAR: Negative for chest pain, dizziness, palpitations and pedal edema.  ?RESPIRATORY: Negative for recent cough and dyspnea.  ? ?PSYCHIATRIC: Negative for sleep disturbance and to question depression screen.  Negative for depression, negative for anhedonia.  ?   ? ? ?Objective:  ?BP 132/68 (BP Location: Left Arm, Patient Position: Sitting)   Pulse 78    Temp 97.6 ?F (36.4 ?C) (Temporal)   Ht 5\' 6"  (1.676 m)   Wt 232 lb (105.2 kg)   SpO2 98%   BMI 37.45 kg/m?  ? ?BP/Weight 04/02/2021 12/13/2020 10/24/2020  ?Systolic BP 132 140 130  ?Diastolic BP 68 72 78  ?Wt. (Lbs) 232 233 233  ?BMI 37.45 37.61 37.61  ? ? ?Physical Exam ?PHYSICAL EXAM:  ? ?VS: BP 132/68 (BP Location: Left Arm, Patient Position: Sitting)   Pulse 78   Temp 97.6 ?F (36.4 ?C) (Temporal)   Ht 5\' 6"  (1.676 m)   Wt 232 lb (105.2 kg)   SpO2 98%   BMI 37.45 kg/m?  ? ?GEN: Well nourished, well developed, in no acute distress  ? ?Cardiac: RRR; no murmurs, ?Respiratory:  normal respiratory rate and pattern with no distress - normal breath sounds with no rales, rhonchi, wheezes or rubs ? ?Psych: euthymic mood, appropriate affect and demeanor ? ?Diabetic Foot Exam - Simple   ?No data filed ?  ?  ? ?Lab Results  ?Component Value Date  ? WBC 4.4 10/24/2020  ? HGB 12.6 10/24/2020  ? HCT 39.0 10/24/2020  ? PLT 239 10/24/2020  ? GLUCOSE 96 10/24/2020  ? CHOL 195 10/24/2020  ? TRIG 76 10/24/2020  ? HDL 60 10/24/2020  ? LDLCALC 121 (H) 10/24/2020  ? ALT 8 10/24/2020  ? AST 12 10/24/2020  ? NA 138 10/24/2020  ? K 4.4 10/24/2020  ? CL 100 10/24/2020  ? CREATININE 0.88 10/24/2020  ? BUN 10 10/24/2020  ? CO2 26 10/24/2020  ? TSH 3.130 10/24/2020  ? INR 1.00 07/08/2013  ? HGBA1C 6.1 (H) 10/24/2020  ? ? ? ? ?Assessment & Plan:  ? ?Problem List Items Addressed This Visit   ?None ?Visit Diagnoses   ? ? Adult-onset obesity    -  Primary  ? Relevant Medications  ? Semaglutide-Weight Management (WEGOVY) 0.25 MG/0.5ML SOAJ ?Continue diet and exercise  ? ?  ?. ? ?Meds ordered this encounter  ?Medications  ? Semaglutide-Weight Management (WEGOVY) 0.25 MG/0.5ML SOAJ  ?  Sig: Inject 0.25 mg into the skin once a week.  ?  Dispense:  2 mL  ?  Refill:  0  ?  Order Specific Question:   Supervising Provider  ?  Answer08/12/2013 10/26/2020  ? ? ?No orders of the defined types were placed in this encounter. ?  ? ?Follow-up:  Return for pt has appt in april. ? ?An After Visit Summary was printed and given to the patient. ? ?SARA R Zyion Doxtater, PA-C ?[facility] ?[phone]

## 2021-04-11 DIAGNOSIS — M6281 Muscle weakness (generalized): Secondary | ICD-10-CM | POA: Diagnosis not present

## 2021-04-11 DIAGNOSIS — R2689 Other abnormalities of gait and mobility: Secondary | ICD-10-CM | POA: Diagnosis not present

## 2021-04-11 DIAGNOSIS — M25572 Pain in left ankle and joints of left foot: Secondary | ICD-10-CM | POA: Diagnosis not present

## 2021-04-11 DIAGNOSIS — M25672 Stiffness of left ankle, not elsewhere classified: Secondary | ICD-10-CM | POA: Diagnosis not present

## 2021-04-16 ENCOUNTER — Telehealth: Payer: Self-pay

## 2021-04-16 NOTE — Telephone Encounter (Signed)
Patient called asking about results for Prior Authorization for  [facility]. Do you know if it was approve? Please advice. ?

## 2021-04-17 NOTE — Telephone Encounter (Signed)
It was denied -- letter should have also been sent to pt by insurance ?

## 2021-04-17 NOTE — Telephone Encounter (Signed)
I called patient and I explained her. ?

## 2021-04-17 NOTE — Telephone Encounter (Signed)
The PA was done and not approved - the note we got back from insurance that was sent to our office and to her states that her insurance does not cover any type of weight loss medication ?

## 2021-04-20 ENCOUNTER — Encounter: Payer: Self-pay | Admitting: Physician Assistant

## 2021-04-22 NOTE — Telephone Encounter (Signed)
We had discussed with patient and went over letter from insurance telling her no meds were covered - who does she want to be referred to? -  ?

## 2021-04-23 ENCOUNTER — Encounter: Payer: Self-pay | Admitting: Physician Assistant

## 2021-04-23 DIAGNOSIS — M6528 Calcific tendinitis, other site: Secondary | ICD-10-CM | POA: Diagnosis not present

## 2021-04-23 NOTE — Telephone Encounter (Signed)
Her insurance sent a letter stating it will not cover any medications for weight loss.  That would be her diagnosis ?Trulicity and Ozempic are used for diabetes ?

## 2021-04-24 ENCOUNTER — Other Ambulatory Visit: Payer: Self-pay | Admitting: Physician Assistant

## 2021-04-24 NOTE — Telephone Encounter (Signed)
Ok to proceed with referral - does this need to be ordered or can you do the referral with this info ? ?

## 2021-05-13 ENCOUNTER — Other Ambulatory Visit: Payer: Self-pay | Admitting: Physician Assistant

## 2021-05-13 DIAGNOSIS — G894 Chronic pain syndrome: Secondary | ICD-10-CM

## 2021-05-14 ENCOUNTER — Encounter: Payer: Self-pay | Admitting: Physician Assistant

## 2021-05-14 ENCOUNTER — Ambulatory Visit: Payer: BC Managed Care – PPO | Admitting: Physician Assistant

## 2021-05-14 DIAGNOSIS — G894 Chronic pain syndrome: Secondary | ICD-10-CM | POA: Diagnosis not present

## 2021-05-14 MED ORDER — TRAMADOL HCL ER 300 MG PO TB24: 300.0000 mg | ORAL_TABLET | Freq: Every day | ORAL | 2 refills | Status: DC

## 2021-05-14 NOTE — Progress Notes (Signed)
? ?Subjective:  ?Patient ID: Christine Christine [name], female    DOB: [birth year]  Age: 61 y.o. MRN: [id number] ? ?Chief Complaint  ?Patient presents with  ? Hypertension  ? ? ? ?Christine is a 61 year old Caucasian female present for follow-up of hypertension,arthritis, vitamin D deficiency, allergic rhinitis, and anxiety.  ? ?Hypertension ?Christine [name] has a history of hypertension for more than 2 years  Current treatment includes Verapamil 120 mg daily. Hypertension is well-controlled with BP 130/82 today in office. She denies chest pain, headache, dizziness, or dyspnea. Christine [name] tries to consume a heart healthy diet. 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  ? ?Osteoarthritis ?Patient has a long history of osteoarthritis.She describes OA as stiffness and aching throughout her body.Her OA is currently treated with Celebrex 200 mg daily. In addition, she takes Tramadol 300 mg for chronic pain and Tramadol 100 mg for breakthrough pain. Pain is moderately well-controlled. She states she is considering Gastric Sleeve surgery and has had her first appt with Dr Norris Cross -   Review of medical record shows DDD diagnosed by x-ray in 2012. She had maximum posterior lumbar interbody fusion with Dr Ronnald Ramp in 2015. 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  ? ?Vitamin D Deficiency ?Current treatment includes vitamin D supplement 2,000U daily. She tries to consume vitamin D rich foods in her diet. Currently well-controlled. Last Vit D level on 08/25/19 was 97. She is compliant with medication regimen and follow-up appointments. She does experience fatigue and chronic joint pain.  ? ?Anxiety ?Christine [name] has a history of anxiety. Current treatment includes Citalopram 40 mg daily. She states symptoms are well-controlled currently.  She does have insomnia associated with her anxiety. She takes Ambien 10 mg at [facility].  ? ?Current Outpatient Medications on File Prior to Visit   ?Medication Sig Dispense Refill  ? Apoaequorin (PREVAGEN EXTRA STRENGTH PO) Take by mouth.    ? BIOTIN PO Take 1 tablet by mouth daily.    ? celecoxib (CELEBREX) 200 MG capsule Take 1 capsule (200 mg total) by mouth daily. 90 capsule 0  ? Cholecalciferol (VITAMIN D3) 2000 UNITS TABS Take 2,000 Units by mouth daily.    ? citalopram (CELEXA) 40 MG tablet Take 1 tablet (40 mg total) by mouth daily. 90 tablet 0  ? cyclobenzaprine (FLEXERIL) 5 MG tablet Take 1 tablet (5 mg total) by mouth at bedtime. 30 tablet 1  ? doxycycline (VIBRA-TABS) 100 MG tablet Take 100 mg by mouth 2 (two) times daily.    ? montelukast (SINGULAIR) 10 MG tablet TAKE ONE TABLET BY MOUTH EVERY DAY 90 tablet 3  ? traMADol (ULTRAM) 50 MG tablet Take 2 tablets (100 mg total) by mouth every 6 (six) hours as needed (break through pain). 90 tablet 0  ? valACYclovir (VALTREX) 1000 MG tablet 2 po q 12 hours for one day for fever blisters 20 tablet 5  ? verapamil (CALAN-SR) 120 MG CR tablet TAKE ONE TABLET BY MOUTH DAILY 90 tablet 0  ? zolpidem (AMBIEN) 10 MG tablet Take 1 tablet (10 mg total) by mouth at bedtime. 30 tablet 2  ? ?No current facility-administered medications on file prior to visit.  ? ?Past Medical History:  ?Diagnosis Date  ? Acute embolism and thrombosis of left popliteal vein (HCC)   ? Arthritis   ? Headache(784.0)   ? hx.migraines/takes verapamil  ? Insomnia   ? Sleep apnea   ? does not  use CPAP  ? ?Past Surgical History:  ?Procedure Laterality Date  ? BUNIONECTOMY    ? CESAREAN SECTION    ? times 2  ? JOINT REPLACEMENT    ? MAXIMUM ACCESS (MAS)POSTERIOR LUMBAR INTERBODY FUSION (PLIF) 2 LEVEL N/A 07/14/2013  ? Procedure: FOR MAXIMUM ACCESS (MAS) POSTERIOR LUMBAR INTERBODY FUSION (PLIF) 2 LEVEL lumbar three/four and four/five;  Surgeon: Eustace Moore, MD;  Location: [facility];  Service: Neurosurgery;  Laterality: N/A;  ?  ?Family History  ?Problem Relation Age of Onset  ? Atrial fibrillation Mother   ? Cancer Mother   ? CAD Father   ?  Diabetes Father   ? Hypertension Father   ? Cancer Father   ? Cancer Maternal Aunt   ? ?Social History  ? ?Socioeconomic History  ? Marital status: Married  ?  Spouse name: Not on file  ? Number of children: 2  ? Years of education: Not on file  ? Highest education level: Not on file  ?Occupational History  ? Not on file  ?Tobacco Use  ? Smoking status: Never  ? Smokeless tobacco: Never  ?Vaping Use  ? Vaping Use: Never used  ?Substance and Sexual Activity  ? Alcohol use: Never  ? Drug use: Never  ? Sexual activity: Not on file  ?Other Topics Concern  ? Not on file  ?Social History Narrative  ? RN at [employer]  ? ?Social Determinants of Health  ? ?Financial Resource Strain: Not on file  ?Food Insecurity: Not on file  ?Transportation Needs: Not on file  ?Physical Activity: Not on file  ?Stress: Not on file  ?Social Connections: Not on file  ? ?CONSTITUTIONAL: Negative for chills, fatigue, fever, unintentional weight gain and unintentional weight loss.  ?E/N/T: Negative for ear pain, nasal congestion and sore throat.  ?CARDIOVASCULAR: Negative for chest pain, dizziness, palpitations and pedal edema.  ?RESPIRATORY: Negative for recent cough and dyspnea.  ?GASTROINTESTINAL: Negative for abdominal pain, acid reflux symptoms, constipation, diarrhea, nausea and vomiting.  ?MSK: see HPI ?INTEGUMENTARY: Negative for rash.  ?NEUROLOGICAL: Negative for dizziness and headaches.  ?PSYCHIATRIC: see HPI ?   ? ? ?Objective:  ? ?  ? PHYSICAL EXAM:  ? ?VS: BP 130/82   Pulse 84   Temp (!) 97.5 ?F (36.4 ?C)   Ht 5\' 6"  (1.676 m)   Wt 234 lb 3.2 oz (106.2 kg)   SpO2 96%   BMI 37.80 kg/m?  ? ?GEN: Well nourished, well developed, in no acute distress  ? ?Cardiac: RRR; no murmurs, rubs, or gallops,no edema - no significant varicosities ?Respiratory:  normal respiratory rate and pattern with no distress - normal breath sounds with no rales, rhonchi, wheezes or rubs ?GI: normal bowel sounds, no masses or tenderness ?MS: no  deformity or atrophy  ?Skin: warm and dry, no rash  ?Psych: euthymic mood, appropriate affect and demeanor ? ?Lab Results  ?Component Value Date  ? WBC 4.4 10/24/2020  ? HGB 12.6 10/24/2020  ? HCT 39.0 10/24/2020  ? PLT 239 10/24/2020  ? GLUCOSE 96 10/24/2020  ? CHOL 195 10/24/2020  ? TRIG 76 10/24/2020  ? HDL 60 10/24/2020  ? LDLCALC 121 (H) 10/24/2020  ? ALT 8 10/24/2020  ? AST 12 10/24/2020  ? NA 138 10/24/2020  ? K 4.4 10/24/2020  ? CL 100 10/24/2020  ? CREATININE 0.88 10/24/2020  ? BUN 10 10/24/2020  ? CO2 26 10/24/2020  ? TSH 3.130 10/24/2020  ? INR 1.00 07/08/2013  ? HGBA1C  6.1 (H) 10/24/2020  ? ? ? ? ?Assessment & Plan:  ? ?1. Hypertension, unspecified type-well controlled ?-Continue current medication ?-Consume heart healthy diet ?Pt not fasting for labs today - will be seeing weight loss clinic (will obtain labwork at that time) ? ?2. Vitamin D insufficiency-well controlled ?-Continue Vit D 2,000 U daily ? ? ?3. Anxiety-Well controlled ? ?-Continue Citalopram 40 mg daily ? ?4. Chronic pain syndrome-moderately well controlled ?-Continue Tramadol as prescribe ?-Continue heart healthy diet ?-Increase physical activity ? ?5. Insomnia, unspecified type-well controlled ?-Continue Ambier 10 mg at HS ? ?6. Allergic rhinitis, unspecified seasonality, unspecified trigger- well controlled ?-Continue Singulair 10 mg daily ? ? ? ? ?Continue regular medications ?Heart healthy diet ?Increase physical activy ? ? ?Follow-up: 32-months ? ? ?SARA R Ozzie Remmers, PA-C ?[facility] ?[phone]

## 2021-05-16 ENCOUNTER — Other Ambulatory Visit: Payer: Self-pay | Admitting: Physician Assistant

## 2021-05-16 DIAGNOSIS — F339 Major depressive disorder, recurrent, unspecified: Secondary | ICD-10-CM

## 2021-05-22 ENCOUNTER — Ambulatory Visit
Admission: RE | Admit: 2021-05-22 | Discharge: 2021-05-22 | Disposition: A | Discharge status: Home / Self Care | Payer: BC Managed Care – PPO | Source: Ambulatory Visit | Attending: Physician Assistant | Admitting: Physician Assistant

## 2021-05-22 ENCOUNTER — Other Ambulatory Visit: Payer: Self-pay | Admitting: Physician Assistant

## 2021-05-22 DIAGNOSIS — Z1231 Encounter for screening mammogram for malignant neoplasm of breast: Secondary | ICD-10-CM

## 2021-06-01 ENCOUNTER — Other Ambulatory Visit: Payer: Self-pay | Admitting: Physician Assistant

## 2021-06-08 ENCOUNTER — Other Ambulatory Visit: Payer: Self-pay | Admitting: Nurse Practitioner

## 2021-06-08 DIAGNOSIS — F5101 Primary insomnia: Secondary | ICD-10-CM

## 2021-06-11 ENCOUNTER — Other Ambulatory Visit: Payer: Self-pay | Admitting: Nurse Practitioner

## 2021-06-11 ENCOUNTER — Other Ambulatory Visit: Payer: Self-pay | Admitting: Physician Assistant

## 2021-06-11 DIAGNOSIS — F5101 Primary insomnia: Secondary | ICD-10-CM

## 2021-07-22 ENCOUNTER — Other Ambulatory Visit: Payer: Self-pay | Admitting: Nurse Practitioner

## 2021-07-22 DIAGNOSIS — F5101 Primary insomnia: Secondary | ICD-10-CM

## 2021-08-14 ENCOUNTER — Other Ambulatory Visit: Payer: Self-pay | Admitting: Physician Assistant

## 2021-08-14 DIAGNOSIS — G894 Chronic pain syndrome: Secondary | ICD-10-CM

## 2021-08-19 ENCOUNTER — Other Ambulatory Visit: Payer: Self-pay | Admitting: Physician Assistant

## 2021-08-20 ENCOUNTER — Other Ambulatory Visit: Payer: Self-pay | Admitting: Nurse Practitioner

## 2021-08-20 ENCOUNTER — Other Ambulatory Visit: Payer: Self-pay | Admitting: Physician Assistant

## 2021-08-20 DIAGNOSIS — F339 Major depressive disorder, recurrent, unspecified: Secondary | ICD-10-CM

## 2021-08-20 DIAGNOSIS — F5101 Primary insomnia: Secondary | ICD-10-CM

## 2021-08-26 ENCOUNTER — Encounter: Payer: Self-pay | Admitting: Physician Assistant

## 2021-08-26 ENCOUNTER — Ambulatory Visit: Payer: BC Managed Care – PPO | Admitting: Physician Assistant

## 2021-08-26 DIAGNOSIS — I10 Essential (primary) hypertension: Secondary | ICD-10-CM | POA: Diagnosis not present

## 2021-08-26 DIAGNOSIS — E559 Vitamin D deficiency, unspecified: Secondary | ICD-10-CM

## 2021-08-26 MED ORDER — WEGOVY 0.5 MG/0.5ML ~~LOC~~ SOAJ: 0.5000 mg | SUBCUTANEOUS | 0 refills | Status: DC

## 2021-08-26 NOTE — Progress Notes (Signed)
Subjective:  Patient ID: Christine [name], female    DOB: 05/12/1960  Age: 61 y.o. MRN: [id number]  Chief Complaint  Patient presents with   Weight Gain    HPI  Pt presents for follow up of hypertension.  The patient is tolerating the medication well without side effects. Compliance with treatment has been good; including taking medication as directed , maintains a healthy diet and regular exercise regimen , and following up as directed. Currently taking verapamil 120mg   Pt with history of morbid obesity - she had changed insurance and was able to get [facility].  She has been on the medication at doseage of 0.25mg  weekly and has lost 10 pounds since starting.  States she is tolerating well and ready for next increase in dose  Of note pt was supposed to go to weight loss clinic and get labwork done after her last visit here in April however she never went and is overdue for labwork - states she will come back later this week to have done  Current Outpatient Medications on File Prior to Visit  Medication Sig Dispense Refill   Apoaequorin (PREVAGEN EXTRA STRENGTH PO) Take by mouth.     BIOTIN PO Take 1 tablet by mouth daily.     celecoxib (CELEBREX) 200 MG capsule Take 1 capsule (200 mg total) by mouth daily. 90 capsule 0   Cholecalciferol (VITAMIN D3) 2000 UNITS TABS Take 2,000 Units by mouth daily.     citalopram (CELEXA) 40 MG tablet Take 1 tablet (40 mg total) by mouth daily. 90 tablet 0   cyclobenzaprine (FLEXERIL) 5 MG tablet Take 1 tablet (5 mg total) by mouth at bedtime. 30 tablet 1   doxycycline (VIBRA-TABS) 100 MG tablet Take 100 mg by mouth 2 (two) times daily.     montelukast (SINGULAIR) 10 MG tablet TAKE ONE TABLET BY MOUTH EVERY DAY 90 tablet 3   traMADol (ULTRAM) 50 MG tablet Take 2 tablets (100 mg total) by mouth every 6 (six) hours as needed (break through pain). 90 tablet 0   traMADol (ULTRAM-ER) 300 MG 24 hr tablet TAKE 1 TABLET BY MOUTH EVERY DAY 30 tablet 2    valACYclovir (VALTREX) 1000 MG tablet 2 po q 12 hours for one day for fever blisters 20 tablet 5   verapamil (CALAN-SR) 120 MG CR tablet TAKE ONE TABLET BY MOUTH DAILY 90 tablet 0   zolpidem (AMBIEN) 10 MG tablet Take 1 tablet (10 mg total) by mouth at bedtime. 30 tablet 0   No current facility-administered medications on file prior to visit.   Past Medical History:  Diagnosis Date   Acute embolism and thrombosis of left popliteal vein (HCC)    Arthritis    Headache(784.0)    hx.migraines/takes verapamil   Insomnia    Sleep apnea    does not use CPAP   Past Surgical History:  Procedure Laterality Date   BUNIONECTOMY     CESAREAN SECTION     times 2   JOINT REPLACEMENT     MAXIMUM ACCESS (MAS)POSTERIOR LUMBAR INTERBODY FUSION (PLIF) 2 LEVEL N/A 07/14/2013   Procedure: FOR MAXIMUM ACCESS (MAS) POSTERIOR LUMBAR INTERBODY FUSION (PLIF) 2 LEVEL lumbar three/four and four/five;  Surgeon: 07/16/2013, MD;  Location: [facility];  Service: Neurosurgery;  Laterality: N/A;    Family History  Problem Relation Age of Onset   Atrial fibrillation Mother    Cancer Mother    CAD Father    Diabetes Father  Hypertension Father    Cancer Father    Cancer Maternal Aunt    Breast cancer Neg Hx    Social History   Socioeconomic History   Marital status: Married    Spouse name: Not on file   Number of children: 2   Years of education: Not on file   Highest education level: Not on file  Occupational History   Not on file  Tobacco Use   Smoking status: Never   Smokeless tobacco: Never  Vaping Use   Vaping Use: Never used  Substance and Sexual Activity   Alcohol use: Never   Drug use: Never   Sexual activity: Not on file  Other Topics Concern   Not on file  Social History Narrative   RN at [employer]   Social Determinants of Health   Financial Resource Strain: Not on file  Food Insecurity: Not on file  Transportation Needs: Not on file  Physical Activity: Not on file   Stress: Not on file  Social Connections: Not on file    Review of Systems  CONSTITUTIONAL: Negative for chills, fatigue, fever,  CARDIOVASCULAR: Negative for chest pain, dizziness,  RESPIRATORY: Negative for recent cough and dyspnea.  GASTROINTESTINAL: see HPI   Objective:  PHYSICAL EXAM:   VS: BP 118/82 (BP Location: Right Arm, Patient Position: Sitting, Cuff Size: Large)   Pulse 79   Temp (!) 97.2 F (36.2 C) (Temporal)   Ht 5\' 6"  (1.676 m)   Wt 224 lb 9.6 oz (101.9 kg)   SpO2 95%   BMI 36.25 kg/m   GEN: Well nourished, well developed, in no acute distress   Cardiac: RRR; no murmurs,  Respiratory:  normal respiratory rate and pattern with no distress - normal breath sounds with no rales, rhonchi, wheezes or rubs  Skin: warm and dry, no rash    Lab Results  Component Value Date   WBC 4.4 10/24/2020   HGB 12.6 10/24/2020   HCT 39.0 10/24/2020   PLT 239 10/24/2020   GLUCOSE 96 10/24/2020   CHOL 195 10/24/2020   TRIG 76 10/24/2020   HDL 60 10/24/2020   LDLCALC 121 (H) 10/24/2020   ALT 8 10/24/2020   AST 12 10/24/2020   NA 138 10/24/2020   K 4.4 10/24/2020   CL 100 10/24/2020   CREATININE 0.88 10/24/2020   BUN 10 10/24/2020   CO2 26 10/24/2020   TSH 3.130 10/24/2020   INR 1.00 07/08/2013   HGBA1C 6.1 (H) 10/24/2020      Assessment & Plan:   Problem List Items Addressed This Visit       Other   Vitamin D insufficiency   Relevant Orders   VITAMIN D 25 Hydroxy (Vit-D Deficiency, Fractures)   Other Visit Diagnoses     Morbid obesity (HCC)    -  Primary   Relevant Medications   Semaglutide-Weight Management (WEGOVY) 0.5 MG/0.5ML SOAJ   Other Relevant Orders   TSH   Hemoglobin A1c   Primary hypertension       Relevant Orders   CBC with Differential/Platelet   Comprehensive metabolic panel   TSH   Lipid panel Continue current meds   Hemoglobin A1c     .  Meds ordered this encounter  Medications   Semaglutide-Weight Management  (WEGOVY) 0.5 MG/0.5ML SOAJ    Sig: Inject 0.5 mg into the skin once a week.    Dispense:  2 mL    Refill:  0    Order Specific Question:  Supervising Provider    Answer:   Blane Ohara [phone]    Orders Placed This Encounter  Procedures   CBC with Differential/Platelet   Comprehensive metabolic panel   TSH   Lipid panel   Hemoglobin A1c   VITAMIN D 25 Hydroxy (Vit-D Deficiency, Fractures)     Follow-up: Return in about 3 months (around 11/26/2021) for chronic follow up.  An After Visit Summary was printed and given to the patient.  Jettie Pagan [facility] [phone]

## 2021-08-28 ENCOUNTER — Other Ambulatory Visit: Payer: BC Managed Care – PPO

## 2021-08-28 DIAGNOSIS — I10 Essential (primary) hypertension: Secondary | ICD-10-CM

## 2021-08-28 DIAGNOSIS — E559 Vitamin D deficiency, unspecified: Secondary | ICD-10-CM | POA: Diagnosis not present

## 2021-08-29 LAB — COMPREHENSIVE METABOLIC PANEL
ALT: 8 IU/L (ref 0–32)
AST: 10 IU/L (ref 0–40)
Albumin/Globulin Ratio: 2.3 — ABNORMAL HIGH (ref 1.2–2.2)
Albumin: 4.3 g/dL (ref 3.8–4.9)
Alkaline Phosphatase: 90 IU/L (ref 44–121)
BUN/Creatinine Ratio: 13 (ref 9–23)
BUN: 11 mg/dL (ref 6–24)
Bilirubin Total: 0.3 mg/dL (ref 0.0–1.2)
CO2: 26 mmol/L (ref 20–29)
Calcium: 9.2 mg/dL (ref 8.7–10.2)
Chloride: 102 mmol/L (ref 96–106)
Creatinine, Ser: 0.88 mg/dL (ref 0.57–1.00)
Globulin, Total: 1.9 g/dL (ref 1.5–4.5)
Glucose: 99 mg/dL (ref 70–99)
Potassium: 4.6 mmol/L (ref 3.5–5.2)
Sodium: 140 mmol/L (ref 134–144)
Total Protein: 6.2 g/dL (ref 6.0–8.5)
eGFR: 76 mL/min/{1.73_m2} (ref >59)

## 2021-08-29 LAB — LIPID PANEL
Chol/HDL Ratio: 3 ratio (ref 0.0–4.4)
Cholesterol, Total: 162 mg/dL (ref 100–199)
HDL: 54 mg/dL (ref >39)
LDL Chol Calc (NIH): 93 mg/dL (ref 0–99)
Triglycerides: 81 mg/dL (ref 0–149)
VLDL Cholesterol Cal: 15 mg/dL (ref 5–40)

## 2021-08-29 LAB — VITAMIN D 25 HYDROXY (VIT D DEFICIENCY, FRACTURES): Vit D, 25-Hydroxy: 140 ng/mL — ABNORMAL HIGH (ref 30.0–100.0)

## 2021-08-29 LAB — CBC WITH DIFFERENTIAL/PLATELET
Basophils Absolute: 0 10*3/uL (ref 0.0–0.2)
Basos: 1 %
EOS (ABSOLUTE): 0.1 10*3/uL (ref 0.0–0.4)
Eos: 2 %
Hematocrit: 36.8 % (ref 34.0–46.6)
Hemoglobin: 12.4 g/dL (ref 11.1–15.9)
Immature Grans (Abs): 0 10*3/uL (ref 0.0–0.1)
Immature Granulocytes: 0 %
Lymphocytes Absolute: 1.6 10*3/uL (ref 0.7–3.1)
Lymphs: 37 %
MCH: 28.3 pg (ref 26.6–33.0)
MCHC: 33.7 g/dL (ref 31.5–35.7)
MCV: 84 fL (ref 79–97)
Monocytes Absolute: 0.3 10*3/uL (ref 0.1–0.9)
Monocytes: 7 %
Neutrophils Absolute: 2.4 10*3/uL (ref 1.4–7.0)
Neutrophils: 53 %
Platelets: 223 10*3/uL (ref 150–450)
RBC: 4.38 x10E6/uL (ref 3.77–5.28)
RDW: 13 % (ref 11.7–15.4)
WBC: 4.4 10*3/uL (ref 3.4–10.8)

## 2021-08-29 LAB — CARDIOVASCULAR RISK ASSESSMENT

## 2021-08-29 LAB — HEMOGLOBIN A1C
Est. average glucose Bld gHb Est-mCnc: 123 mg/dL
Hgb A1c MFr Bld: 5.9 % — ABNORMAL HIGH (ref 4.8–5.6)

## 2021-08-29 LAB — TSH: TSH: 3.63 u[IU]/mL (ref 0.450–4.500)

## 2021-09-01 NOTE — Progress Notes (Signed)
Blood count normal.  Liver function normal.  Kidney function normal.  Thyroid function normal.  Cholesterol:improved. LDL 93.  HBA1C: 5.9. improved.  Vitamin D very high. Please find out what she is taking otc as she is not on rx vitamin D. She needs to decrease or discontinue depending on dose she is on.

## 2021-09-12 ENCOUNTER — Encounter: Payer: Self-pay | Admitting: Physician Assistant

## 2021-09-12 ENCOUNTER — Other Ambulatory Visit: Payer: Self-pay | Admitting: Physician Assistant

## 2021-09-12 MED ORDER — ONDANSETRON HCL 4 MG PO TABS: 4.0000 mg | ORAL_TABLET | Freq: Three times a day (TID) | ORAL | 0 refills | Status: AC | PRN

## 2021-09-17 ENCOUNTER — Other Ambulatory Visit: Payer: Self-pay | Admitting: Physician Assistant

## 2021-09-17 ENCOUNTER — Other Ambulatory Visit: Payer: Self-pay | Admitting: Nurse Practitioner

## 2021-09-17 DIAGNOSIS — F5101 Primary insomnia: Secondary | ICD-10-CM

## 2021-10-01 ENCOUNTER — Telehealth: Payer: Self-pay

## 2021-10-01 ENCOUNTER — Other Ambulatory Visit: Payer: Self-pay | Admitting: Nurse Practitioner

## 2021-10-01 ENCOUNTER — Encounter: Payer: Self-pay | Admitting: Physician Assistant

## 2021-10-01 ENCOUNTER — Encounter: Payer: Self-pay | Admitting: Nurse Practitioner

## 2021-10-01 DIAGNOSIS — E6609 Other obesity due to excess calories: Secondary | ICD-10-CM

## 2021-10-01 MED ORDER — WEGOVY 1.7 MG/0.75ML ~~LOC~~ SOAJ: 1.7000 mg | SUBCUTANEOUS | 0 refills | Status: DC

## 2021-10-01 NOTE — Telephone Encounter (Signed)
Her last dose was prescribed on 7/31 for 0.5mg  weekly Why is she requesting to increase to 1.7mg ?? - that is not what her next dose would be

## 2021-10-01 NOTE — Telephone Encounter (Signed)
Patient called and stated she will need a RX sent over to [facility] in [place] for her wegovy, She is needed the 1.7 mg, please advise

## 2021-10-02 NOTE — Telephone Encounter (Signed)
Christine [name] called [facility] and pt HAS NOT had 1mg  filled at any [facility] - will send in 1mg  weekly - notify pt that [facility] does not have it available - she will have to call around and see where available and will send in

## 2021-10-09 ENCOUNTER — Other Ambulatory Visit: Payer: Self-pay

## 2021-10-09 DIAGNOSIS — E6609 Other obesity due to excess calories: Secondary | ICD-10-CM

## 2021-10-09 MED ORDER — WEGOVY 1 MG/0.5ML ~~LOC~~ SOAJ: 1.0000 mg | SUBCUTANEOUS | 0 refills | Status: DC

## 2021-10-16 ENCOUNTER — Other Ambulatory Visit: Payer: Self-pay | Admitting: Physician Assistant

## 2021-10-16 DIAGNOSIS — F5101 Primary insomnia: Secondary | ICD-10-CM

## 2021-11-06 ENCOUNTER — Other Ambulatory Visit: Payer: Self-pay

## 2021-11-07 ENCOUNTER — Other Ambulatory Visit: Payer: Self-pay

## 2021-11-08 ENCOUNTER — Other Ambulatory Visit: Payer: Self-pay

## 2021-11-08 MED ORDER — SEMAGLUTIDE-WEIGHT MANAGEMENT 1.7 MG/0.75ML ~~LOC~~ SOAJ: 1.7000 mg | SUBCUTANEOUS | 0 refills | Status: DC

## 2021-11-11 ENCOUNTER — Other Ambulatory Visit: Payer: Self-pay | Admitting: Physician Assistant

## 2021-11-11 DIAGNOSIS — G894 Chronic pain syndrome: Secondary | ICD-10-CM

## 2021-11-18 ENCOUNTER — Other Ambulatory Visit: Payer: Self-pay | Admitting: Physician Assistant

## 2021-11-18 DIAGNOSIS — F339 Major depressive disorder, recurrent, unspecified: Secondary | ICD-10-CM

## 2021-11-18 DIAGNOSIS — F5101 Primary insomnia: Secondary | ICD-10-CM

## 2021-11-19 ENCOUNTER — Ambulatory Visit: Payer: BC Managed Care – PPO | Admitting: Physician Assistant

## 2021-11-29 ENCOUNTER — Other Ambulatory Visit: Payer: Self-pay | Admitting: Physician Assistant

## 2021-12-01 DIAGNOSIS — J019 Acute sinusitis, unspecified: Secondary | ICD-10-CM | POA: Diagnosis not present

## 2021-12-01 DIAGNOSIS — U071 COVID-19: Secondary | ICD-10-CM | POA: Diagnosis not present

## 2021-12-01 DIAGNOSIS — R051 Acute cough: Secondary | ICD-10-CM | POA: Diagnosis not present

## 2021-12-03 ENCOUNTER — Ambulatory Visit: Payer: BC Managed Care – PPO | Admitting: Physician Assistant

## 2021-12-03 ENCOUNTER — Encounter: Payer: Self-pay | Admitting: Physician Assistant

## 2021-12-03 VITALS — BP 118/80 | HR 83 | Temp 97.1°F | Ht 66.0 in | Wt 208.0 lb

## 2021-12-03 DIAGNOSIS — G894 Chronic pain syndrome: Secondary | ICD-10-CM

## 2021-12-03 DIAGNOSIS — Z1231 Encounter for screening mammogram for malignant neoplasm of breast: Secondary | ICD-10-CM

## 2021-12-03 DIAGNOSIS — R7303 Prediabetes: Secondary | ICD-10-CM | POA: Diagnosis not present

## 2021-12-03 DIAGNOSIS — F339 Major depressive disorder, recurrent, unspecified: Secondary | ICD-10-CM

## 2021-12-03 DIAGNOSIS — Z23 Encounter for immunization: Secondary | ICD-10-CM

## 2021-12-03 DIAGNOSIS — E66812 Obesity, class 2: Secondary | ICD-10-CM

## 2021-12-03 DIAGNOSIS — E559 Vitamin D deficiency, unspecified: Secondary | ICD-10-CM

## 2021-12-03 DIAGNOSIS — F5101 Primary insomnia: Secondary | ICD-10-CM

## 2021-12-03 DIAGNOSIS — Z6836 Body mass index (BMI) 36.0-36.9, adult: Secondary | ICD-10-CM

## 2021-12-03 DIAGNOSIS — I10 Essential (primary) hypertension: Secondary | ICD-10-CM | POA: Diagnosis not present

## 2021-12-03 DIAGNOSIS — E6609 Other obesity due to excess calories: Secondary | ICD-10-CM

## 2021-12-03 MED ORDER — WEGOVY 2.4 MG/0.75ML ~~LOC~~ SOAJ: 2.4000 mg | SUBCUTANEOUS | 2 refills | Status: DC

## 2021-12-03 NOTE — Progress Notes (Signed)
Subjective:  Patient ID: Christine [name], female    DOB: [birth year]  Age: 61 y.o. MRN: [id number]  Chief Complaint  Patient presents with   Hypertension     Christine [name] is a 61 year old Caucasian female present for follow-up of hypertension,arthritis, vitamin D deficiency, allergic rhinitis, and anxiety.   Hypertension Fleta has a history of hypertension for more than 3 years  Current treatment includes Verapamil 120 mg daily. Hypertension is well-controlled with BP 118/80 today in office. She denies chest pain, headache, dizziness, or dyspnea. Tynia tries to consume a heart healthy diet. 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   Osteoarthritis Patient has a long history of osteoarthritis.She describes OA as stiffness and aching throughout her body.Her OA is currently treated with Celebrex 200 mg daily. In addition, she takes Tramadol 300 mg for chronic pain for prior back surgery - she states she had lumbar fusion less than 10 years ago but cannot recall name of her Careers adviser. She states that if she does not take the extended release medication it causes her to ache all over and have pain in her back.  Recommend that since she continues this chronic medication she will need to sign pain management contract and will do drug screen urine - pt is agreeable to plan.  She used to be on tramadol 50mg  prn as well but states she rarely takes that medication and would only use about once yearly. Pain is moderately well-controlled.  Review of medical record shows DJD diagnosed by x-ray in 2012. She had maximum posterior lumbar interbody fusion with Dr 2013 in 2015. 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   Vitamin D Deficiency Pt had been taking otc vit D supplements but last labwork showed moderately elevated Vit D levels and was advised to stop supplement - will recheck labwork today   Anxiety Arijana has a history of anxiety.  Current treatment includes Citalopram 40 mg daily. She states symptoms are well-controlled currently.  She does have insomnia associated with her anxiety. She takes Ambien 10 mg at [facility].   Pt with history of obesity and had been started on wegovy - she is tolerating medication well and is currently on 1.7mg  weekly and ready to increase to 2.4mg  dosing.  She has lost 16 pounds since last visit.  BMI went from 36.27 to 33.57 today  Pt would like to go ahead and schedule mammogram that will be due in 5/24  Pt would like flu shot today Current Outpatient Medications on File Prior to Visit  Medication Sig Dispense Refill   BIOTIN PO Take 1 tablet by mouth daily.     celecoxib (CELEBREX) 200 MG capsule Take 1 capsule (200 mg total) by mouth daily. 90 capsule 0   citalopram (CELEXA) 40 MG tablet Take 1 tablet (40 mg total) by mouth daily. 90 tablet 0   cyclobenzaprine (FLEXERIL) 5 MG tablet Take 1 tablet (5 mg total) by mouth at bedtime. 30 tablet 1   montelukast (SINGULAIR) 10 MG tablet TAKE ONE TABLET BY MOUTH EVERY DAY 90 tablet 3   ondansetron (ZOFRAN) 4 MG tablet Take 1 tablet (4 mg total) by mouth every 8 (eight) hours as needed for nausea or vomiting. 20 tablet 0   traMADol (ULTRAM-ER) 300 MG 24 hr tablet TAKE 1 TABLET BY MOUTH EVERY DAY 30 tablet 0   valACYclovir (VALTREX) 1000 MG tablet 2 po q 12 hours for  one day for fever blisters 20 tablet 5   verapamil (CALAN-SR) 120 MG CR tablet TAKE ONE TABLET BY MOUTH DAILY 90 tablet 0   zolpidem (AMBIEN) 10 MG tablet Take 1 tablet (10 mg total) by mouth at bedtime. 30 tablet 0   No current facility-administered medications on file prior to visit.   Past Medical History:  Diagnosis Date   Acute embolism and thrombosis of left popliteal vein (HCC)    Arthritis    Headache(784.0)    hx.migraines/takes verapamil   Insomnia    Sleep apnea    does not use CPAP   Past Surgical History:  Procedure Laterality Date   BUNIONECTOMY     CESAREAN  SECTION     times 2   JOINT REPLACEMENT     MAXIMUM ACCESS (MAS)POSTERIOR LUMBAR INTERBODY FUSION (PLIF) 2 LEVEL N/A 07/14/2013   Procedure: FOR MAXIMUM ACCESS (MAS) POSTERIOR LUMBAR INTERBODY FUSION (PLIF) 2 LEVEL lumbar three/four and four/five;  Surgeon: Tia Alert, MD;  Location: [facility];  Service: Neurosurgery;  Laterality: N/A;    Family History  Problem Relation Age of Onset   Atrial fibrillation Mother    Cancer Mother    CAD Father    Diabetes Father    Hypertension Father    Cancer Father    Cancer Maternal Aunt    Breast cancer Neg Hx    Social History   Socioeconomic History   Marital status: Married    Spouse name: Not on file   Number of children: 2   Years of education: Not on file   Highest education level: Not on file  Occupational History   Not on file  Tobacco Use   Smoking status: Never   Smokeless tobacco: Never  Vaping Use   Vaping Use: Never used  Substance and Sexual Activity   Alcohol use: Never   Drug use: Never   Sexual activity: Not on file  Other Topics Concern   Not on file  Social History Narrative   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  E/N/T: Negative for ear pain, nasal congestion and sore throat.  CARDIOVASCULAR: Negative for chest pain, dizziness, palpitations and pedal edema.  RESPIRATORY: Negative for recent cough and dyspnea.  GASTROINTESTINAL: Negative for abdominal pain, acid reflux symptoms, constipation, diarrhea, nausea and vomiting.  MSK: see HPI INTEGUMENTARY: Negative for rash.  PSYCHIATRIC: Negative for sleep disturbance and to question depression screen.  Negative for depression, negative  for anhedonia.       Objective:  PHYSICAL EXAM:   VS: BP 118/80 (BP Location: Left Arm, Patient Position: Sitting, Cuff Size: Large)   Pulse 83   Temp (!) 97.1 F (36.2 C) (Temporal)   Ht 5\' 6"  (1.676 m)   Wt 208 lb (94.3 kg)   SpO2 97%   BMI 33.57 kg/m   GEN: Well nourished, well developed, in no acute distress   Cardiac: RRR; no murmurs, rubs, or gallops,no edema -  Respiratory:  normal respiratory rate and pattern with no distress - normal breath sounds with no rales, rhonchi, wheezes or rubs  MS: no deformity or atrophy  Skin: warm and dry, no rash  Psych: euthymic mood, appropriate affect and demeanor  Lab Results  Component Value Date   WBC 4.4 08/28/2021   HGB 12.4 08/28/2021   HCT 36.8 08/28/2021   PLT 223 08/28/2021   GLUCOSE 99 08/28/2021   CHOL 162 08/28/2021   TRIG 81 08/28/2021   HDL 54 08/28/2021   LDLCALC 93 08/28/2021   ALT 8 08/28/2021   AST 10 08/28/2021   NA 140 08/28/2021   K 4.6 08/28/2021   CL 102 08/28/2021   CREATININE 0.88 08/28/2021   BUN 11 08/28/2021   CO2 26 08/28/2021   TSH 3.630 08/28/2021   INR 1.00 07/08/2013   HGBA1C 5.9 (H) 08/28/2021      Assessment & Plan:   1. Hypertension, unspecified type-well controlled -Continue current medication -Consume heart healthy diet Labwork pending  2. Vitamin D insufficiency- Labwork pending Continue to hold supplement  3. Anxiety-Well controlled -Continue Citalopram 40 mg daily  4. Chronic pain syndrome-moderately well controlled -Continue Tramadol as prescribed -Increase physical activity Drug screen pending Pain contract signed  5. Insomnia, unspecified type-well controlled -Continue Ambier 10 mg at HS  6. Need for flu vaccine Flucelvax given  7. Breast cancer screening Mammogram scheduled  8. Obesity/weight gain Continue wegovy - 2.4mg  weekly rx sent    Follow-up: 3 months for fasting physical/pap   SARA R Kathie Rhodes [facility] [phone]

## 2021-12-04 LAB — LIPID PANEL
Chol/HDL Ratio: 2.6 ratio (ref 0.0–4.4)
Cholesterol, Total: 138 mg/dL (ref 100–199)
HDL: 54 mg/dL (ref >39)
LDL Chol Calc (NIH): 70 mg/dL (ref 0–99)
Triglycerides: 70 mg/dL (ref 0–149)
VLDL Cholesterol Cal: 14 mg/dL (ref 5–40)

## 2021-12-04 LAB — CBC WITH DIFFERENTIAL/PLATELET
Basophils Absolute: 0 10*3/uL (ref 0.0–0.2)
Basos: 0 %
EOS (ABSOLUTE): 0.1 10*3/uL (ref 0.0–0.4)
Eos: 1 %
Hematocrit: 39.3 % (ref 34.0–46.6)
Hemoglobin: 12.4 g/dL (ref 11.1–15.9)
Immature Grans (Abs): 0 10*3/uL (ref 0.0–0.1)
Immature Granulocytes: 0 %
Lymphocytes Absolute: 1.7 10*3/uL (ref 0.7–3.1)
Lymphs: 46 %
MCH: 27.9 pg (ref 26.6–33.0)
MCHC: 31.6 g/dL (ref 31.5–35.7)
MCV: 89 fL (ref 79–97)
Monocytes Absolute: 0.4 10*3/uL (ref 0.1–0.9)
Monocytes: 9 %
Neutrophils Absolute: 1.7 10*3/uL (ref 1.4–7.0)
Neutrophils: 44 %
Platelets: 226 10*3/uL (ref 150–450)
RBC: 4.44 x10E6/uL (ref 3.77–5.28)
RDW: 13.4 % (ref 11.7–15.4)
WBC: 3.8 10*3/uL (ref 3.4–10.8)

## 2021-12-04 LAB — PAIN MGT SCRN (14 DRUGS), UR
Amphetamine Scrn, Ur: NEGATIVE ng/mL
BARBITURATE SCREEN URINE: NEGATIVE ng/mL
BENZODIAZEPINE SCREEN, URINE: NEGATIVE ng/mL
Buprenorphine, Urine: NEGATIVE ng/mL
CANNABINOIDS UR QL SCN: NEGATIVE ng/mL
Cocaine (Metab) Scrn, Ur: NEGATIVE ng/mL
Creatinine(Crt), U: 45.5 mg/dL (ref 20.0–300.0)
Fentanyl, Urine: NEGATIVE pg/mL
Meperidine Screen, Urine: NEGATIVE ng/mL
Methadone Screen, Urine: NEGATIVE ng/mL
OXYCODONE+OXYMORPHONE UR QL SCN: NEGATIVE ng/mL
Opiate Scrn, Ur: NEGATIVE ng/mL
Ph of Urine: 5.9 (ref 4.5–8.9)
Phencyclidine Qn, Ur: NEGATIVE ng/mL
Propoxyphene Scrn, Ur: NEGATIVE ng/mL
Tramadol Screen, Urine: POSITIVE ng/mL — AB

## 2021-12-04 LAB — COMPREHENSIVE METABOLIC PANEL
ALT: 11 IU/L (ref 0–32)
AST: 10 IU/L (ref 0–40)
Albumin/Globulin Ratio: 2.2 (ref 1.2–2.2)
Albumin: 4.2 g/dL (ref 3.8–4.9)
Alkaline Phosphatase: 87 IU/L (ref 44–121)
BUN/Creatinine Ratio: 10 (ref 9–23)
BUN: 8 mg/dL (ref 6–24)
Bilirubin Total: <0.2 mg/dL (ref 0.0–1.2)
CO2: 26 mmol/L (ref 20–29)
Calcium: 9.3 mg/dL (ref 8.7–10.2)
Chloride: 99 mmol/L (ref 96–106)
Creatinine, Ser: 0.83 mg/dL (ref 0.57–1.00)
Globulin, Total: 1.9 g/dL (ref 1.5–4.5)
Glucose: 88 mg/dL (ref 70–99)
Potassium: 3.7 mmol/L (ref 3.5–5.2)
Sodium: 141 mmol/L (ref 134–144)
Total Protein: 6.1 g/dL (ref 6.0–8.5)
eGFR: 82 mL/min/{1.73_m2} (ref >59)

## 2021-12-04 LAB — CARDIOVASCULAR RISK ASSESSMENT

## 2021-12-04 LAB — VITAMIN D 25 HYDROXY (VIT D DEFICIENCY, FRACTURES): Vit D, 25-Hydroxy: 74.8 ng/mL (ref 30.0–100.0)

## 2021-12-04 LAB — TSH: TSH: 2.9 u[IU]/mL (ref 0.450–4.500)

## 2021-12-04 LAB — HEMOGLOBIN A1C
Est. average glucose Bld gHb Est-mCnc: 111 mg/dL
Hgb A1c MFr Bld: 5.5 % (ref 4.8–5.6)

## 2021-12-12 ENCOUNTER — Other Ambulatory Visit: Payer: Self-pay | Admitting: Physician Assistant

## 2021-12-12 DIAGNOSIS — G894 Chronic pain syndrome: Secondary | ICD-10-CM

## 2021-12-16 ENCOUNTER — Other Ambulatory Visit: Payer: Self-pay | Admitting: Physician Assistant

## 2021-12-16 DIAGNOSIS — F5101 Primary insomnia: Secondary | ICD-10-CM

## 2021-12-27 DIAGNOSIS — H5203 Hypermetropia, bilateral: Secondary | ICD-10-CM | POA: Diagnosis not present

## 2021-12-27 DIAGNOSIS — H04123 Dry eye syndrome of bilateral lacrimal glands: Secondary | ICD-10-CM | POA: Diagnosis not present

## 2021-12-27 DIAGNOSIS — H2513 Age-related nuclear cataract, bilateral: Secondary | ICD-10-CM | POA: Diagnosis not present

## 2022-01-14 ENCOUNTER — Other Ambulatory Visit: Payer: Self-pay | Admitting: Physician Assistant

## 2022-01-14 DIAGNOSIS — G894 Chronic pain syndrome: Secondary | ICD-10-CM

## 2022-01-15 DIAGNOSIS — M7662 Achilles tendinitis, left leg: Secondary | ICD-10-CM | POA: Diagnosis not present

## 2022-01-15 DIAGNOSIS — M25572 Pain in left ankle and joints of left foot: Secondary | ICD-10-CM | POA: Diagnosis not present

## 2022-01-15 DIAGNOSIS — M6702 Short Achilles tendon (acquired), left ankle: Secondary | ICD-10-CM | POA: Diagnosis not present

## 2022-01-16 ENCOUNTER — Other Ambulatory Visit: Payer: Self-pay | Admitting: Physician Assistant

## 2022-01-16 DIAGNOSIS — F5101 Primary insomnia: Secondary | ICD-10-CM

## 2022-01-17 DIAGNOSIS — R1110 Vomiting, unspecified: Secondary | ICD-10-CM | POA: Diagnosis not present

## 2022-01-30 ENCOUNTER — Other Ambulatory Visit: Payer: Self-pay | Admitting: Family Medicine

## 2022-02-02 DIAGNOSIS — M25572 Pain in left ankle and joints of left foot: Secondary | ICD-10-CM | POA: Diagnosis not present

## 2022-02-10 DIAGNOSIS — M6702 Short Achilles tendon (acquired), left ankle: Secondary | ICD-10-CM | POA: Diagnosis not present

## 2022-02-10 DIAGNOSIS — M7662 Achilles tendinitis, left leg: Secondary | ICD-10-CM | POA: Diagnosis not present

## 2022-02-10 DIAGNOSIS — M898X7 Other specified disorders of bone, ankle and foot: Secondary | ICD-10-CM | POA: Diagnosis not present

## 2022-02-13 ENCOUNTER — Other Ambulatory Visit: Payer: Self-pay | Admitting: Physician Assistant

## 2022-02-13 DIAGNOSIS — F5101 Primary insomnia: Secondary | ICD-10-CM

## 2022-02-17 ENCOUNTER — Other Ambulatory Visit: Payer: Self-pay | Admitting: Physician Assistant

## 2022-02-17 DIAGNOSIS — G894 Chronic pain syndrome: Secondary | ICD-10-CM

## 2022-02-21 ENCOUNTER — Other Ambulatory Visit: Payer: Self-pay | Admitting: Physician Assistant

## 2022-02-21 DIAGNOSIS — M25572 Pain in left ankle and joints of left foot: Secondary | ICD-10-CM | POA: Diagnosis not present

## 2022-02-21 DIAGNOSIS — Z6836 Body mass index (BMI) 36.0-36.9, adult: Secondary | ICD-10-CM

## 2022-03-04 DIAGNOSIS — M7662 Achilles tendinitis, left leg: Secondary | ICD-10-CM | POA: Diagnosis not present

## 2022-03-04 DIAGNOSIS — M24572 Contracture, left ankle: Secondary | ICD-10-CM | POA: Diagnosis not present

## 2022-03-04 DIAGNOSIS — M7732 Calcaneal spur, left foot: Secondary | ICD-10-CM | POA: Diagnosis not present

## 2022-03-04 DIAGNOSIS — M6702 Short Achilles tendon (acquired), left ankle: Secondary | ICD-10-CM | POA: Diagnosis not present

## 2022-03-04 DIAGNOSIS — G8918 Other acute postprocedural pain: Secondary | ICD-10-CM | POA: Diagnosis not present

## 2022-03-04 DIAGNOSIS — M65272 Calcific tendinitis, left ankle and foot: Secondary | ICD-10-CM | POA: Diagnosis not present

## 2022-03-14 ENCOUNTER — Encounter: Payer: Self-pay | Admitting: Physician Assistant

## 2022-03-14 ENCOUNTER — Ambulatory Visit (INDEPENDENT_AMBULATORY_CARE_PROVIDER_SITE_OTHER): Payer: BC Managed Care – PPO | Admitting: Physician Assistant

## 2022-03-14 VITALS — BP 114/74 | HR 74 | Temp 97.4°F | Ht 64.5 in | Wt 198.4 lb

## 2022-03-14 DIAGNOSIS — Z0000 Encounter for general adult medical examination without abnormal findings: Secondary | ICD-10-CM | POA: Diagnosis not present

## 2022-03-14 LAB — POCT URINALYSIS DIP (CLINITEK)
Bilirubin, UA: NEGATIVE
Blood, UA: NEGATIVE
Glucose, UA: NEGATIVE mg/dL
Ketones, POC UA: NEGATIVE mg/dL
Leukocytes, UA: NEGATIVE
Nitrite, UA: NEGATIVE
Spec Grav, UA: >=1.03 — AB (ref 1.010–1.025)
Urobilinogen, UA: 0.2 E.U./dL
pH, UA: 5.5 (ref 5.0–8.0)

## 2022-03-14 NOTE — Progress Notes (Signed)
Subjective:  Patient ID: Christine [name], female    DOB: 10/19/1960  Age: 62 y.o. MRN: [id number]  Chief Complaint  Patient presents with   Annual Exam    HPI Well Adult Physical: Patient here for a comprehensive physical exam.The patient reports  she had surgery on 03/04/22 by Dr Doran Durand for a heel spur and Achilles tendon repair - is in a cast today Do you take any herbs or supplements that were not prescribed by a doctor? no Are you taking calcium supplements? no Are you taking aspirin daily? yes  Encounter for general adult medical examination without abnormal findings  Physical ("At Risk" items are starred): Patient's last physical exam was 1 year ago .  Patient is not afflicted from Stress Incontinence and Urge Incontinence  Patient wears a seat belts Patient has smoke detectors and has carbon monoxide detectors. Patient practices appropriate gun safety. Patient wears sunscreen with extended sun exposure. Dental Care: biannual cleanings, brushes and flosses daily. Ophthalmology/Optometry: Annual visit.  Hearing loss: none Vision impairments: none  LMP Safe at home: yes Self breast exams: yes     03/14/2022    9:53 AM 12/03/2021    9:22 AM 05/14/2021   10:43 AM 10/24/2020    9:19 AM 01/12/2020    2:38 PM  Depression screen PHQ 2/9  Decreased Interest 0 0 0 0 0  Down, Depressed, Hopeless 0 0 0 3 0  PHQ - 2 Score 0 0 0 3 0  Altered sleeping 0 0 0 3   Tired, decreased energy 0 0 1 2   Change in appetite 0 0 1 3   Feeling bad or failure about yourself  0 0 0 3   Trouble concentrating 0 0 0 3   Moving slowly or fidgety/restless 0 0 0 2   Suicidal thoughts 0 0 0 2   PHQ-9 Score 0 0 2 21   Difficult doing work/chores Not difficult at all Not difficult at all  Very difficult          10/24/2020    8:52 AM 05/14/2021   10:43 AM 03/14/2022    9:53 AM  Fall Risk  Falls in the past year? 0 0 0  Was there an injury with Fall? 0 0 0  Fall Risk Category Calculator 0 0 0   Fall Risk Category (Retired) Low Low   (RETIRED) Patient Fall Risk Level Low fall risk Low fall risk   Patient at Risk for Falls Due to   No Fall Risks  Fall risk Follow up Falls evaluation completed  Falls evaluation completed             Social Hx   Social History   Socioeconomic History   Marital status: Married    Spouse name: Not on file   Number of children: 2   Years of education: Not on file   Highest education level: Not on file  Occupational History   Not on file  Tobacco Use   Smoking status: Never   Smokeless tobacco: Never  Vaping Use   Vaping Use: Never used  Substance and Sexual Activity   Alcohol use: Never   Drug use: Never   Sexual activity: Not on file  Other Topics Concern   Not on file  Social History Narrative   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   Social Determinants of Health   Financial Resource Strain: Not on file  Food Insecurity: Not on file  Transportation Needs: Not on file  Physical Activity: Not on file  Stress: Not on file  Social Connections: Not on file   Past Medical History:  Diagnosis Date   Acute embolism and thrombosis of left popliteal vein (HCC)    Arthritis    Headache(784.0)    hx.migraines/takes verapamil   Insomnia    Sleep apnea    does not use CPAP   Past Surgical History:  Procedure Laterality Date   BUNIONECTOMY     CESAREAN SECTION     times 2   JOINT REPLACEMENT     MAXIMUM ACCESS (MAS)POSTERIOR LUMBAR INTERBODY FUSION (PLIF) 2 LEVEL N/A 07/14/2013   Procedure: FOR MAXIMUM ACCESS (MAS) POSTERIOR LUMBAR INTERBODY FUSION (PLIF) 2 LEVEL lumbar three/four and four/five;  Surgeon: Eustace Moore, MD;  Location: [place] [facility];  Service: Neurosurgery;  Laterality: N/A;    Family History  Problem Relation Age of Onset   Atrial fibrillation Mother    Cancer Mother    CAD Father    Diabetes Father    Hypertension Father    Cancer Father    Cancer Maternal Aunt    Breast cancer Neg Hx     Review of  Systems  CONSTITUTIONAL: Negative for chills, fatigue, fever, unintentional weight gain and unintentional weight loss.  E/N/T: Negative for ear pain, nasal congestion and sore throat.  CARDIOVASCULAR: Negative for chest pain, dizziness, palpitations and pedal edema.  RESPIRATORY: Negative for recent cough and dyspnea.  GASTROINTESTINAL: Negative for abdominal pain, acid reflux symptoms, constipation, diarrhea, nausea and vomiting.  MSK: see HPI INTEGUMENTARY: Negative for rash.  NEUROLOGICAL: Negative for dizziness and headaches.  PSYCHIATRIC: Negative for sleep disturbance and to question depression screen.  Negative for depression, negative for anhedonia.      Objective:  BP 114/74 (BP Location: Left Arm, Patient Position: Sitting, Cuff Size: Large)   Pulse 74   Temp (!) 97.4 F (36.3 C) (Temporal)   Ht 5' 4.5" (1.638 m)   Wt 198 lb 6.4 oz (90 kg)   SpO2 97%   BMI 33.53 kg/m      03/14/2022    9:53 AM 12/03/2021    9:21 AM 08/26/2021    3:45 PM  BP/Weight  Systolic BP 99991111 [id number] [id number]  Diastolic BP 74 80 82  Wt. (Lbs) 198.4 208 224.6  BMI 33.53 kg/m2 33.57 kg/m2 36.25 kg/m2    Physical Exam PHYSICAL EXAM:  Vision Screening   Right eye Left eye Both eyes  Without correction     With correction 20/20 20/25 20/20 $    VS: BP 114/74 (BP Location: Left Arm, Patient Position: Sitting, Cuff Size: Large)   Pulse 74   Temp (!) 97.4 F (36.3 C) (Temporal)   Ht 5' 4.5" (1.638 m)   Wt 198 lb 6.4 oz (90 kg)   SpO2 97%   BMI 33.53 kg/m   GEN: Well nourished, well developed, in no acute distress  HEENT: normal external ears and nose - normal external auditory canals and TMS - hearing grossly normal - Lips, Teeth and Gums - normal  Oropharynx - normal mucosa, palate, and posterior pharynx Cardiac: RRR; no murmurs, rubs, or gallops,no edema - Respiratory:  normal respiratory rate and pattern with no distress - normal breath sounds with no rales, rhonchi, wheezes or rubs Breasts - no  masses -  GI: normal bowel sounds, no masses or tenderness GU - normal external genitalia - cervix normal - bimanual exam normal MS: no deformity or atrophy  Skin: warm and dry, no rash  Neuro:  Alert and Oriented x 3, Strength and sensation are intact - CN II-Xii grossly intact Psych: euthymic mood, appropriate affect and demeanor  Office Visit on 03/14/2022  Component Date Value Ref Range Status   Color, UA 03/14/2022 yellow  yellow Final   Clarity, UA 03/14/2022 clear  clear Final   Glucose, UA 03/14/2022 negative  negative mg/dL Final   Bilirubin, UA 03/14/2022 negative  negative Final   Ketones, POC UA 03/14/2022 negative  negative mg/dL Final   Spec Grav, UA 03/14/2022 >=1.030 (A)  1.010 - 1.025 Final   Blood, UA 03/14/2022 negative  negative Final   pH, UA 03/14/2022 5.5  5.0 - 8.0 Final   POC PROTEIN,UA 03/14/2022 trace  negative, trace Final   Urobilinogen, UA 03/14/2022 0.2  0.2 or 1.0 E.U./dL Final   Nitrite, UA 03/14/2022 Negative  Negative Final   Leukocytes, UA 03/14/2022 Negative  Negative Final    Lab Results  Component Value Date   WBC 3.8 12/03/2021   HGB 12.4 12/03/2021   HCT 39.3 12/03/2021   PLT 226 12/03/2021   GLUCOSE 88 12/03/2021   CHOL 138 12/03/2021   TRIG 70 12/03/2021   HDL 54 12/03/2021   LDLCALC 70 12/03/2021   ALT 11 12/03/2021   AST 10 12/03/2021   NA 141 12/03/2021   K 3.7 12/03/2021   CL 99 12/03/2021   CREATININE 0.83 12/03/2021   BUN 8 12/03/2021   CO2 26 12/03/2021   TSH 2.900 12/03/2021   INR 1.00 07/08/2013   HGBA1C 5.5 12/03/2021      Assessment & Plan:  Christine [name] was seen today for annual exam.  Annual physical exam -     POCT URINALYSIS DIP (CLINITEK) -     CBC with Differential/Platelet -     Comprehensive metabolic panel -     TSH -     Lipid panel -     IGP, Aptima HPV, rfx 16/18,45 -     VITAMIN D 25 Hydroxy (Vit-D Deficiency, Fractures)      Body mass index is 33.53 kg/m.   These are the goals we  discussed:  Goals   None      This is a list of the screening recommended for you and due dates:  Health Maintenance  Topic Date Due   Zoster (Shingles) Vaccine (1 of 2) Never done   Pap Smear  12/06/2018   Mammogram  05/23/2022   Colon Cancer Screening  02/09/2023   DTaP/Tdap/Td vaccine (2 - Td or Tdap) 08/24/2029   Flu Shot  Completed   HPV Vaccine  Aged Out   COVID-19 Vaccine  Discontinued   Hepatitis C Screening: USPSTF Recommendation to screen - Ages 18-79 yo.  Discontinued   HIV Screening  Discontinued     No orders of the defined types were placed in this encounter.   Follow-up: Return in about 3 months (around 06/12/2022) for chronic fasting follow up.  An After Visit Summary was printed and given to the patient.  Yetta Flock [facility] [phone]

## 2022-03-15 LAB — COMPREHENSIVE METABOLIC PANEL
ALT: 9 IU/L (ref 0–32)
AST: 11 IU/L (ref 0–40)
Albumin/Globulin Ratio: 2.1 (ref 1.2–2.2)
Albumin: 4.4 g/dL (ref 3.8–4.9)
Alkaline Phosphatase: 97 IU/L (ref 44–121)
BUN/Creatinine Ratio: 12 (ref 9–23)
BUN: 11 mg/dL (ref 6–24)
Bilirubin Total: 0.5 mg/dL (ref 0.0–1.2)
CO2: 24 mmol/L (ref 20–29)
Calcium: 9.6 mg/dL (ref 8.7–10.2)
Chloride: 100 mmol/L (ref 96–106)
Creatinine, Ser: 0.9 mg/dL (ref 0.57–1.00)
Globulin, Total: 2.1 g/dL (ref 1.5–4.5)
Glucose: 95 mg/dL (ref 70–99)
Potassium: 4.6 mmol/L (ref 3.5–5.2)
Sodium: 139 mmol/L (ref 134–144)
Total Protein: 6.5 g/dL (ref 6.0–8.5)
eGFR: 74 mL/min/{1.73_m2} (ref >59)

## 2022-03-15 LAB — CBC WITH DIFFERENTIAL/PLATELET
Basophils Absolute: 0 10*3/uL (ref 0.0–0.2)
Basos: 0 %
EOS (ABSOLUTE): 0.1 10*3/uL (ref 0.0–0.4)
Eos: 2 %
Hematocrit: 37.8 % (ref 34.0–46.6)
Hemoglobin: 12.4 g/dL (ref 11.1–15.9)
Immature Grans (Abs): 0 10*3/uL (ref 0.0–0.1)
Immature Granulocytes: 0 %
Lymphocytes Absolute: 1.3 10*3/uL (ref 0.7–3.1)
Lymphs: 26 %
MCH: 29.1 pg (ref 26.6–33.0)
MCHC: 32.8 g/dL (ref 31.5–35.7)
MCV: 89 fL (ref 79–97)
Monocytes Absolute: 0.4 10*3/uL (ref 0.1–0.9)
Monocytes: 7 %
Neutrophils Absolute: 3.2 10*3/uL (ref 1.4–7.0)
Neutrophils: 65 %
Platelets: 243 10*3/uL (ref 150–450)
RBC: 4.26 x10E6/uL (ref 3.77–5.28)
RDW: 13.3 % (ref 11.7–15.4)
WBC: 5.1 10*3/uL (ref 3.4–10.8)

## 2022-03-15 LAB — CARDIOVASCULAR RISK ASSESSMENT

## 2022-03-15 LAB — LIPID PANEL
Chol/HDL Ratio: 3 ratio (ref 0.0–4.4)
Cholesterol, Total: 186 mg/dL (ref 100–199)
HDL: 62 mg/dL (ref >39)
LDL Chol Calc (NIH): 112 mg/dL — ABNORMAL HIGH (ref 0–99)
Triglycerides: 64 mg/dL (ref 0–149)
VLDL Cholesterol Cal: 12 mg/dL (ref 5–40)

## 2022-03-15 LAB — VITAMIN D 25 HYDROXY (VIT D DEFICIENCY, FRACTURES): Vit D, 25-Hydroxy: 64.1 ng/mL (ref 30.0–100.0)

## 2022-03-15 LAB — TSH: TSH: 1.51 u[IU]/mL (ref 0.450–4.500)

## 2022-03-17 ENCOUNTER — Other Ambulatory Visit: Payer: Self-pay | Admitting: Physician Assistant

## 2022-03-17 DIAGNOSIS — F339 Major depressive disorder, recurrent, unspecified: Secondary | ICD-10-CM

## 2022-03-18 DIAGNOSIS — M6702 Short Achilles tendon (acquired), left ankle: Secondary | ICD-10-CM | POA: Diagnosis not present

## 2022-03-19 ENCOUNTER — Other Ambulatory Visit: Payer: Self-pay | Admitting: Physician Assistant

## 2022-03-20 LAB — IGP, APTIMA HPV, RFX 16/18,45
HPV Aptima: NEGATIVE
PAP Smear Comment: 0

## 2022-03-21 ENCOUNTER — Other Ambulatory Visit: Payer: Self-pay | Admitting: Physician Assistant

## 2022-03-21 DIAGNOSIS — G894 Chronic pain syndrome: Secondary | ICD-10-CM

## 2022-03-24 ENCOUNTER — Other Ambulatory Visit: Payer: Self-pay | Admitting: Physician Assistant

## 2022-03-24 DIAGNOSIS — E6609 Other obesity due to excess calories: Secondary | ICD-10-CM

## 2022-04-14 ENCOUNTER — Other Ambulatory Visit: Payer: Self-pay | Admitting: Physician Assistant

## 2022-04-14 DIAGNOSIS — F5101 Primary insomnia: Secondary | ICD-10-CM

## 2022-04-17 ENCOUNTER — Other Ambulatory Visit: Payer: Self-pay | Admitting: Physician Assistant

## 2022-04-17 DIAGNOSIS — G894 Chronic pain syndrome: Secondary | ICD-10-CM

## 2022-05-14 ENCOUNTER — Other Ambulatory Visit: Payer: Self-pay | Admitting: Physician Assistant

## 2022-05-14 DIAGNOSIS — F5101 Primary insomnia: Secondary | ICD-10-CM

## 2022-05-14 DIAGNOSIS — M21611 Bunion of right foot: Secondary | ICD-10-CM | POA: Diagnosis not present

## 2022-05-21 ENCOUNTER — Other Ambulatory Visit: Payer: Self-pay | Admitting: Physician Assistant

## 2022-05-21 DIAGNOSIS — G894 Chronic pain syndrome: Secondary | ICD-10-CM

## 2022-06-04 ENCOUNTER — Ambulatory Visit
Admission: RE | Admit: 2022-06-04 | Discharge: 2022-06-04 | Disposition: A | Discharge status: Home / Self Care | Payer: BC Managed Care – PPO | Source: Ambulatory Visit | Attending: Physician Assistant | Admitting: Physician Assistant

## 2022-06-04 DIAGNOSIS — Z1231 Encounter for screening mammogram for malignant neoplasm of breast: Secondary | ICD-10-CM | POA: Diagnosis not present

## 2022-06-10 ENCOUNTER — Other Ambulatory Visit: Payer: Self-pay | Admitting: Physician Assistant

## 2022-06-10 DIAGNOSIS — E6609 Other obesity due to excess calories: Secondary | ICD-10-CM

## 2022-06-13 ENCOUNTER — Other Ambulatory Visit: Payer: Self-pay | Admitting: Physician Assistant

## 2022-06-13 DIAGNOSIS — F5101 Primary insomnia: Secondary | ICD-10-CM

## 2022-06-13 DIAGNOSIS — F339 Major depressive disorder, recurrent, unspecified: Secondary | ICD-10-CM

## 2022-06-16 ENCOUNTER — Other Ambulatory Visit: Payer: Self-pay | Admitting: Physician Assistant

## 2022-06-16 DIAGNOSIS — G894 Chronic pain syndrome: Secondary | ICD-10-CM

## 2022-06-30 ENCOUNTER — Ambulatory Visit (INDEPENDENT_AMBULATORY_CARE_PROVIDER_SITE_OTHER): Payer: BC Managed Care – PPO | Admitting: Physician Assistant

## 2022-06-30 ENCOUNTER — Encounter: Payer: Self-pay | Admitting: Physician Assistant

## 2022-06-30 VITALS — BP 112/72 | HR 90 | Temp 97.2°F | Ht 64.5 in | Wt 191.2 lb

## 2022-06-30 DIAGNOSIS — E6609 Other obesity due to excess calories: Secondary | ICD-10-CM | POA: Diagnosis not present

## 2022-06-30 DIAGNOSIS — Z6832 Body mass index (BMI) 32.0-32.9, adult: Secondary | ICD-10-CM | POA: Diagnosis not present

## 2022-06-30 DIAGNOSIS — G894 Chronic pain syndrome: Secondary | ICD-10-CM

## 2022-06-30 DIAGNOSIS — Z23 Encounter for immunization: Secondary | ICD-10-CM | POA: Diagnosis not present

## 2022-06-30 DIAGNOSIS — E782 Mixed hyperlipidemia: Secondary | ICD-10-CM | POA: Insufficient documentation

## 2022-06-30 NOTE — Progress Notes (Signed)
Subjective:  Patient ID: Christine [name], female    DOB: [birth year]  Age: 62 y.o. MRN: [id number]  Chief Complaint  Patient presents with   Hypertension     Christine [name] is a 62 year old Caucasian female present for follow-up of hypertension,arthritis, vitamin D deficiency, allergic rhinitis, and anxiety.   Hypertension Indika has a history of hypertension noted in chart (from prior provider in 2021) however Christine [name] states she was placed on verapamil also for migraines - Current treatment includes Verapamil 120 mg daily. Hypertension is well-controlled with BP 112/72 today in office. She denies chest pain, headache, dizziness, or dyspnea. Christine [name] tries to consume a heart healthy diet. She is physically active with Christine [name] job as a Surveyor, mining.   Osteoarthritis Patient has a long history of osteoarthritis.She describes OA as stiffness and aching throughout Christine [name] body.Christine [name] OA is currently treated with Celebrex 200 mg daily. 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 She states that if she does not take the extended release medication it causes Christine [name] to ache all over and have pain in Christine [name] back.  She does have pain contract -  Pain is moderately well-controlled.  Review of medical record shows DJD diagnosed by x-ray in 2012. She had maximum posterior lumbar interbody fusion with Dr Yetta Barre in 2015. 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   Anxiety Christine [name] has a history of anxiety. Current treatment includes Citalopram 40 mg daily. She states symptoms are well-controlled currently.  She does have insomnia associated with Christine [name] anxiety. She takes Ambien 10 mg at [facility].   Christine [name] with history of obesity and had been started on wegovy - she is tolerating medication well and is currently on 2.4mg  weekly  She has lost 7 pounds since last visit.   She states in July Christine [name] medication will start being filled by [facility]  Christine [name] would like first shingrix today Current Outpatient Medications on File Prior to Visit  Medication Sig Dispense Refill   celecoxib (CELEBREX) 200 MG capsule Take 1 capsule (200 mg total) by mouth daily. 90 capsule 0   citalopram (CELEXA) 40 MG tablet Take 1 tablet (40 mg total) by mouth daily. 90 tablet 0   cyclobenzaprine (FLEXERIL) 5 MG tablet Take 1 tablet (5 mg total) by mouth at bedtime. 30 tablet 1   montelukast (SINGULAIR) 10 MG tablet TAKE ONE TABLET BY MOUTH EVERY DAY 90 tablet 3   ondansetron (ZOFRAN) 4 MG tablet Take 1 tablet (4 mg total) by mouth every 8 (eight) hours as needed for nausea or vomiting. 20 tablet 0   traMADol (ULTRAM-ER) 300 MG 24 hr tablet TAKE 1 TABLET BY MOUTH EVERY DAY 30 tablet 0   valACYclovir (VALTREX) 1000 MG tablet TAKE 2 TABLETS BY MOUTH EVERY 12 HOURS FOR 1 DAY FOR FEVER BLISTERS 20 tablet 5   verapamil (CALAN-SR) 120 MG CR tablet TAKE ONE TABLET BY MOUTH DAILY 90 tablet 0   WEGOVY 2.4 MG/0.75ML SOAJ INJECT 2.4 MG SUBCUTANEOUSLY ONCE A WEEK 4 mL 0   zolpidem (AMBIEN) 10 MG tablet TAKE ONE TABLET BY MOUTH AT BEDTIME 30 tablet 0   No current facility-administered medications on file prior to visit.   Past Medical History:  Diagnosis Date   Acute embolism and thrombosis of left popliteal vein (HCC)  Arthritis    Headache(784.0)    hx.migraines/takes verapamil   Insomnia    Sleep apnea    does not use CPAP   Past Surgical History:  Procedure Laterality Date   BUNIONECTOMY     CESAREAN SECTION     times 2   JOINT REPLACEMENT     MAXIMUM ACCESS (MAS)POSTERIOR LUMBAR INTERBODY FUSION (PLIF) 2 LEVEL N/A 07/14/2013   Procedure: FOR MAXIMUM ACCESS (MAS) POSTERIOR LUMBAR INTERBODY FUSION (PLIF) 2 LEVEL lumbar three/four and four/five;  Surgeon: Tia Alert, MD;  Location: [facility];  Service: Neurosurgery;  Laterality: N/A;    Family History  Problem Relation Age of Onset    Atrial fibrillation Mother    Cancer Mother    CAD Father    Diabetes Father    Hypertension Father    Cancer Father    Cancer Maternal Aunt    Breast cancer Neg Hx    Social History   Socioeconomic History   Marital status: Married    Spouse name: Not on file   Number of children: 2   Years of education: Not on file   Highest education level: Not on file  Occupational History   Not on file  Tobacco Use   Smoking status: Never   Smokeless tobacco: Never  Vaping Use   Vaping Use: Never used  Substance and Sexual Activity   Alcohol use: Never   Drug use: Never   Sexual activity: Not on file  Other Topics Concern   Not on file  Social History Narrative   RN at [employer]   Social Determinants of Health   Financial Resource Strain: Low Risk  (06/30/2022)   Overall Financial Resource Strain (CARDIA)    Difficulty of Paying Living Expenses: Not hard at all  Food Insecurity: No Food Insecurity (06/30/2022)   Hunger Vital Sign    Worried About Running Out of Food in the Last Year: Never true    Ran Out of Food in the Last Year: Never true  Transportation Needs: No Transportation Needs (06/30/2022)   PRAPARE - Administrator, Civil Service (Medical): No    Lack of Transportation (Non-Medical): No  Physical Activity: Inactive (06/30/2022)   Exercise Vital Sign    Days of Exercise per Week: 0 days    Minutes of Exercise per Session: 0 min  Stress: No Stress Concern Present (06/30/2022)   [employer] of Occupational Health - Occupational Stress Questionnaire    Feeling of Stress : Not at all  Social Connections: Moderately Integrated (06/30/2022)   Social Connection and Isolation Panel [NHANES]    Frequency of Communication with Friends and Family: More than three times a week    Frequency of Social Gatherings with Friends and Family: More than three times a week    Attends Religious Services: More than 4 times per year    Active Member of [employer] or  Organizations: No    Attends Banker Meetings: Never    Marital Status: Married   CONSTITUTIONAL: Negative for chills, fatigue, fever, unintentional weight gain and unintentional weight loss.  E/N/T: Negative for ear pain, nasal congestion and sore throat.  CARDIOVASCULAR: Negative for chest pain, dizziness, palpitations and pedal edema.  RESPIRATORY: Negative for recent cough and dyspnea.  GASTROINTESTINAL: Negative for abdominal pain, acid reflux symptoms, constipation, diarrhea, nausea and vomiting.  MSK: Negative for arthralgias and myalgias.  INTEGUMENTARY: Negative for rash.  NEUROLOGICAL: Negative for dizziness and headaches.  PSYCHIATRIC: Negative for  sleep disturbance and to question depression screen.  Negative for depression, negative for anhedonia.       Objective:  PHYSICAL EXAM:   VS: BP 112/72 (BP Location: Left Arm, Patient Position: Sitting, Cuff Size: Large)   Pulse 90   Temp (!) 97.2 F (36.2 C) (Temporal)   Ht 5' 4.5" (1.638 m)   Wt 191 lb 3.2 oz (86.7 kg)   SpO2 98%   BMI 32.31 kg/m   GEN: Well nourished, well developed, in no acute distress  Cardiac: RRR; no murmurs, rubs, or gallops,no edema -  Respiratory:  normal respiratory rate and pattern with no distress - normal breath sounds with no rales, rhonchi, wheezes or rubs MS: no deformity or atrophy  Skin: warm and dry, no rash  Psych: euthymic mood, appropriate affect and demeanor   Lab Results  Component Value Date   WBC 5.1 03/14/2022   HGB 12.4 03/14/2022   HCT 37.8 03/14/2022   PLT 243 03/14/2022   GLUCOSE 95 03/14/2022   CHOL 186 03/14/2022   TRIG 64 03/14/2022   HDL 62 03/14/2022   LDLCALC 112 (H) 03/14/2022   ALT 9 03/14/2022   AST 11 03/14/2022   NA 139 03/14/2022   K 4.6 03/14/2022   CL 100 03/14/2022   CREATININE 0.90 03/14/2022   BUN 11 03/14/2022   CO2 24 03/14/2022   TSH 1.510 03/14/2022   INR 1.00 07/08/2013   HGBA1C 5.5 12/03/2021      Assessment &  Plan:   1. Hypertension, unspecified type-well controlled -Continue current medication -Consume heart healthy diet Labwork pending  2. Anxiety-Well controlled -Continue Citalopram 40 mg daily  3. Chronic pain syndrome-moderately well controlled -Continue Tramadol as prescribed -Increase physical activity  4. Insomnia, unspecified type-well controlled -Continue Ambier 10 mg at HS  5 Need for Zoster vaccine Shingrix given  6. Obesity/weight gain Continue wegovy - 2.4mg  weekly rx sent    Follow-up: 4 months   SARA R Dimas [facility] [phone]

## 2022-07-01 LAB — COMPREHENSIVE METABOLIC PANEL
ALT: 7 IU/L (ref 0–32)
AST: 11 IU/L (ref 0–40)
Albumin/Globulin Ratio: 2 (ref 1.2–2.2)
Albumin: 4.2 g/dL (ref 3.8–4.9)
Alkaline Phosphatase: 92 IU/L (ref 44–121)
BUN/Creatinine Ratio: 12 (ref 9–23)
BUN: 10 mg/dL (ref 6–24)
Bilirubin Total: 0.4 mg/dL (ref 0.0–1.2)
CO2: 26 mmol/L (ref 20–29)
Calcium: 9.3 mg/dL (ref 8.7–10.2)
Chloride: 101 mmol/L (ref 96–106)
Creatinine, Ser: 0.86 mg/dL (ref 0.57–1.00)
Globulin, Total: 2.1 g/dL (ref 1.5–4.5)
Glucose: 89 mg/dL (ref 70–99)
Potassium: 4.5 mmol/L (ref 3.5–5.2)
Sodium: 140 mmol/L (ref 134–144)
Total Protein: 6.3 g/dL (ref 6.0–8.5)
eGFR: 78 mL/min/{1.73_m2} (ref >59)

## 2022-07-01 LAB — LIPID PANEL
Chol/HDL Ratio: 2.7 ratio (ref 0.0–4.4)
Cholesterol, Total: 172 mg/dL (ref 100–199)
HDL: 64 mg/dL (ref >39)
LDL Chol Calc (NIH): 97 mg/dL (ref 0–99)
Triglycerides: 58 mg/dL (ref 0–149)
VLDL Cholesterol Cal: 11 mg/dL (ref 5–40)

## 2022-07-01 LAB — CBC WITH DIFFERENTIAL/PLATELET
Basophils Absolute: 0 10*3/uL (ref 0.0–0.2)
Basos: 1 %
EOS (ABSOLUTE): 0.1 10*3/uL (ref 0.0–0.4)
Eos: 3 %
Hematocrit: 36.8 % (ref 34.0–46.6)
Hemoglobin: 12.2 g/dL (ref 11.1–15.9)
Immature Grans (Abs): 0 10*3/uL (ref 0.0–0.1)
Immature Granulocytes: 0 %
Lymphocytes Absolute: 1.4 10*3/uL (ref 0.7–3.1)
Lymphs: 40 %
MCH: 29 pg (ref 26.6–33.0)
MCHC: 33.2 g/dL (ref 31.5–35.7)
MCV: 88 fL (ref 79–97)
Monocytes Absolute: 0.3 10*3/uL (ref 0.1–0.9)
Monocytes: 8 %
Neutrophils Absolute: 1.7 10*3/uL (ref 1.4–7.0)
Neutrophils: 48 %
Platelets: 221 10*3/uL (ref 150–450)
RBC: 4.2 x10E6/uL (ref 3.77–5.28)
RDW: 12.7 % (ref 11.7–15.4)
WBC: 3.4 10*3/uL (ref 3.4–10.8)

## 2022-07-01 LAB — CARDIOVASCULAR RISK ASSESSMENT

## 2022-07-09 ENCOUNTER — Other Ambulatory Visit: Payer: Self-pay | Admitting: Family Medicine

## 2022-07-09 DIAGNOSIS — E6609 Other obesity due to excess calories: Secondary | ICD-10-CM

## 2022-07-17 ENCOUNTER — Other Ambulatory Visit: Payer: Self-pay | Admitting: Family Medicine

## 2022-07-17 DIAGNOSIS — F5101 Primary insomnia: Secondary | ICD-10-CM

## 2022-07-18 ENCOUNTER — Other Ambulatory Visit: Payer: Self-pay | Admitting: Family Medicine

## 2022-07-18 DIAGNOSIS — G894 Chronic pain syndrome: Secondary | ICD-10-CM

## 2022-07-22 DIAGNOSIS — G8918 Other acute postprocedural pain: Secondary | ICD-10-CM | POA: Diagnosis not present

## 2022-07-22 DIAGNOSIS — M21611 Bunion of right foot: Secondary | ICD-10-CM | POA: Diagnosis not present

## 2022-08-12 ENCOUNTER — Other Ambulatory Visit: Payer: Self-pay | Admitting: Physician Assistant

## 2022-08-16 ENCOUNTER — Other Ambulatory Visit: Payer: Self-pay | Admitting: Physician Assistant

## 2022-08-16 DIAGNOSIS — E6609 Other obesity due to excess calories: Secondary | ICD-10-CM

## 2022-08-18 ENCOUNTER — Other Ambulatory Visit: Payer: Self-pay | Admitting: Physician Assistant

## 2022-08-18 DIAGNOSIS — G894 Chronic pain syndrome: Secondary | ICD-10-CM

## 2022-09-03 DIAGNOSIS — G4700 Insomnia, unspecified: Secondary | ICD-10-CM | POA: Diagnosis not present

## 2022-09-03 DIAGNOSIS — G43009 Migraine without aura, not intractable, without status migrainosus: Secondary | ICD-10-CM | POA: Diagnosis not present

## 2022-09-03 DIAGNOSIS — M170 Bilateral primary osteoarthritis of knee: Secondary | ICD-10-CM | POA: Diagnosis not present

## 2022-09-03 DIAGNOSIS — F3341 Major depressive disorder, recurrent, in partial remission: Secondary | ICD-10-CM | POA: Diagnosis not present

## 2022-09-08 DIAGNOSIS — M2011 Hallux valgus (acquired), right foot: Secondary | ICD-10-CM | POA: Diagnosis not present

## 2022-09-11 ENCOUNTER — Other Ambulatory Visit: Payer: Self-pay | Admitting: Family Medicine

## 2022-09-11 ENCOUNTER — Other Ambulatory Visit: Payer: Self-pay | Admitting: Physician Assistant

## 2022-09-11 DIAGNOSIS — F339 Major depressive disorder, recurrent, unspecified: Secondary | ICD-10-CM

## 2022-09-11 DIAGNOSIS — F5101 Primary insomnia: Secondary | ICD-10-CM

## 2022-10-13 ENCOUNTER — Other Ambulatory Visit: Payer: Self-pay | Admitting: Family Medicine

## 2022-10-13 DIAGNOSIS — F5101 Primary insomnia: Secondary | ICD-10-CM

## 2022-10-15 DIAGNOSIS — M2011 Hallux valgus (acquired), right foot: Secondary | ICD-10-CM | POA: Diagnosis not present

## 2022-10-23 DIAGNOSIS — R1011 Right upper quadrant pain: Secondary | ICD-10-CM | POA: Diagnosis not present

## 2022-10-23 DIAGNOSIS — Z6830 Body mass index (BMI) 30.0-30.9, adult: Secondary | ICD-10-CM | POA: Diagnosis not present

## 2022-10-25 DIAGNOSIS — K8020 Calculus of gallbladder without cholecystitis without obstruction: Secondary | ICD-10-CM | POA: Diagnosis not present

## 2022-10-25 DIAGNOSIS — R1011 Right upper quadrant pain: Secondary | ICD-10-CM | POA: Diagnosis not present

## 2022-10-27 ENCOUNTER — Other Ambulatory Visit: Payer: Self-pay | Admitting: Physician Assistant

## 2022-10-27 DIAGNOSIS — G894 Chronic pain syndrome: Secondary | ICD-10-CM

## 2022-10-30 DIAGNOSIS — K8010 Calculus of gallbladder with chronic cholecystitis without obstruction: Secondary | ICD-10-CM | POA: Diagnosis not present

## 2022-10-31 ENCOUNTER — Other Ambulatory Visit: Payer: Self-pay | Admitting: Physician Assistant

## 2022-10-31 DIAGNOSIS — G894 Chronic pain syndrome: Secondary | ICD-10-CM

## 2022-11-05 ENCOUNTER — Ambulatory Visit: Payer: BC Managed Care – PPO | Admitting: Physician Assistant

## 2022-11-11 ENCOUNTER — Other Ambulatory Visit: Payer: Self-pay | Admitting: Family Medicine

## 2022-11-11 ENCOUNTER — Other Ambulatory Visit: Payer: Self-pay | Admitting: Physician Assistant

## 2022-11-11 DIAGNOSIS — F5101 Primary insomnia: Secondary | ICD-10-CM

## 2022-11-17 DIAGNOSIS — G4700 Insomnia, unspecified: Secondary | ICD-10-CM | POA: Diagnosis not present

## 2022-11-17 DIAGNOSIS — Z79899 Other long term (current) drug therapy: Secondary | ICD-10-CM | POA: Diagnosis not present

## 2022-11-17 DIAGNOSIS — Z6838 Body mass index (BMI) 38.0-38.9, adult: Secondary | ICD-10-CM | POA: Diagnosis not present

## 2022-11-17 DIAGNOSIS — K8010 Calculus of gallbladder with chronic cholecystitis without obstruction: Secondary | ICD-10-CM | POA: Diagnosis not present

## 2022-11-17 DIAGNOSIS — M170 Bilateral primary osteoarthritis of knee: Secondary | ICD-10-CM | POA: Diagnosis not present

## 2022-11-17 DIAGNOSIS — G43009 Migraine without aura, not intractable, without status migrainosus: Secondary | ICD-10-CM | POA: Diagnosis not present

## 2022-11-17 DIAGNOSIS — K8020 Calculus of gallbladder without cholecystitis without obstruction: Secondary | ICD-10-CM | POA: Diagnosis not present

## 2022-11-17 DIAGNOSIS — E669 Obesity, unspecified: Secondary | ICD-10-CM | POA: Diagnosis not present

## 2022-11-17 DIAGNOSIS — F3341 Major depressive disorder, recurrent, in partial remission: Secondary | ICD-10-CM | POA: Diagnosis not present

## 2022-12-01 DIAGNOSIS — G43009 Migraine without aura, not intractable, without status migrainosus: Secondary | ICD-10-CM | POA: Diagnosis not present

## 2022-12-01 DIAGNOSIS — Z6832 Body mass index (BMI) 32.0-32.9, adult: Secondary | ICD-10-CM | POA: Diagnosis not present

## 2022-12-10 ENCOUNTER — Other Ambulatory Visit: Payer: Self-pay | Admitting: Physician Assistant

## 2022-12-10 ENCOUNTER — Other Ambulatory Visit: Payer: Self-pay | Admitting: Family Medicine

## 2022-12-10 DIAGNOSIS — F5101 Primary insomnia: Secondary | ICD-10-CM

## 2022-12-10 DIAGNOSIS — F339 Major depressive disorder, recurrent, unspecified: Secondary | ICD-10-CM

## 2022-12-18 ENCOUNTER — Other Ambulatory Visit: Payer: Self-pay | Admitting: Physician Assistant

## 2022-12-26 ENCOUNTER — Other Ambulatory Visit: Payer: Self-pay | Admitting: Physician Assistant

## 2022-12-29 ENCOUNTER — Other Ambulatory Visit: Payer: Self-pay | Admitting: Physician Assistant

## 2022-12-29 DIAGNOSIS — F339 Major depressive disorder, recurrent, unspecified: Secondary | ICD-10-CM

## 2023-01-02 ENCOUNTER — Other Ambulatory Visit: Payer: Self-pay | Admitting: Physician Assistant

## 2023-01-07 DIAGNOSIS — H04122 Dry eye syndrome of left lacrimal gland: Secondary | ICD-10-CM | POA: Diagnosis not present

## 2023-01-07 DIAGNOSIS — H2513 Age-related nuclear cataract, bilateral: Secondary | ICD-10-CM | POA: Diagnosis not present

## 2023-01-07 DIAGNOSIS — H5203 Hypermetropia, bilateral: Secondary | ICD-10-CM | POA: Diagnosis not present

## 2023-01-27 ENCOUNTER — Other Ambulatory Visit: Payer: Self-pay | Admitting: Physician Assistant

## 2023-02-09 ENCOUNTER — Other Ambulatory Visit: Payer: Self-pay | Admitting: Physician Assistant

## 2023-03-03 DIAGNOSIS — G4700 Insomnia, unspecified: Secondary | ICD-10-CM | POA: Diagnosis not present

## 2023-03-03 DIAGNOSIS — F3341 Major depressive disorder, recurrent, in partial remission: Secondary | ICD-10-CM | POA: Diagnosis not present

## 2023-03-03 DIAGNOSIS — G43009 Migraine without aura, not intractable, without status migrainosus: Secondary | ICD-10-CM | POA: Diagnosis not present

## 2023-03-03 DIAGNOSIS — M170 Bilateral primary osteoarthritis of knee: Secondary | ICD-10-CM | POA: Diagnosis not present

## 2023-03-03 DIAGNOSIS — E559 Vitamin D deficiency, unspecified: Secondary | ICD-10-CM | POA: Diagnosis not present

## 2023-03-03 DIAGNOSIS — Z13220 Encounter for screening for lipoid disorders: Secondary | ICD-10-CM | POA: Diagnosis not present

## 2023-03-10 ENCOUNTER — Other Ambulatory Visit: Payer: Self-pay | Admitting: Physician Assistant

## 2023-03-10 DIAGNOSIS — F339 Major depressive disorder, recurrent, unspecified: Secondary | ICD-10-CM

## 2023-04-06 DIAGNOSIS — Z01818 Encounter for other preprocedural examination: Secondary | ICD-10-CM | POA: Diagnosis not present

## 2023-04-09 ENCOUNTER — Other Ambulatory Visit: Payer: Self-pay | Admitting: Physician Assistant

## 2023-04-09 DIAGNOSIS — F339 Major depressive disorder, recurrent, unspecified: Secondary | ICD-10-CM

## 2023-05-11 ENCOUNTER — Other Ambulatory Visit: Payer: Self-pay | Admitting: Physician Assistant

## 2023-05-11 DIAGNOSIS — F339 Major depressive disorder, recurrent, unspecified: Secondary | ICD-10-CM

## 2023-05-19 DIAGNOSIS — K648 Other hemorrhoids: Secondary | ICD-10-CM | POA: Diagnosis not present

## 2023-05-19 DIAGNOSIS — K644 Residual hemorrhoidal skin tags: Secondary | ICD-10-CM | POA: Diagnosis not present

## 2023-05-19 DIAGNOSIS — K5730 Diverticulosis of large intestine without perforation or abscess without bleeding: Secondary | ICD-10-CM | POA: Diagnosis not present

## 2023-05-19 DIAGNOSIS — Z800 Family history of malignant neoplasm of digestive organs: Secondary | ICD-10-CM | POA: Diagnosis not present

## 2023-06-02 ENCOUNTER — Encounter (HOSPITAL_BASED_OUTPATIENT_CLINIC_OR_DEPARTMENT_OTHER): Payer: Self-pay | Admitting: Pharmacy Technician

## 2023-06-02 ENCOUNTER — Other Ambulatory Visit (HOSPITAL_BASED_OUTPATIENT_CLINIC_OR_DEPARTMENT_OTHER): Payer: Self-pay

## 2023-06-02 DIAGNOSIS — F3341 Major depressive disorder, recurrent, in partial remission: Secondary | ICD-10-CM | POA: Diagnosis not present

## 2023-06-02 DIAGNOSIS — M170 Bilateral primary osteoarthritis of knee: Secondary | ICD-10-CM | POA: Diagnosis not present

## 2023-06-02 DIAGNOSIS — Z13220 Encounter for screening for lipoid disorders: Secondary | ICD-10-CM | POA: Diagnosis not present

## 2023-06-02 DIAGNOSIS — G43009 Migraine without aura, not intractable, without status migrainosus: Secondary | ICD-10-CM | POA: Diagnosis not present

## 2023-06-02 DIAGNOSIS — G4700 Insomnia, unspecified: Secondary | ICD-10-CM | POA: Diagnosis not present

## 2023-06-02 MED ORDER — ZEPBOUND 2.5 MG/0.5ML ~~LOC~~ SOAJ
2.5000 mg | SUBCUTANEOUS | 1 refills | Status: AC
  Filled 2023-06-02 – 2023-06-08 (×3): qty 2, 28d supply, fill #0

## 2023-06-04 ENCOUNTER — Other Ambulatory Visit (HOSPITAL_BASED_OUTPATIENT_CLINIC_OR_DEPARTMENT_OTHER): Payer: Self-pay

## 2023-06-04 ENCOUNTER — Encounter (HOSPITAL_BASED_OUTPATIENT_CLINIC_OR_DEPARTMENT_OTHER): Payer: Self-pay | Admitting: Pharmacy Technician

## 2023-06-05 ENCOUNTER — Other Ambulatory Visit (HOSPITAL_BASED_OUTPATIENT_CLINIC_OR_DEPARTMENT_OTHER): Payer: Self-pay

## 2023-06-05 MED ORDER — ZEPBOUND 5 MG/0.5ML ~~LOC~~ SOAJ: 5.0000 mg | SUBCUTANEOUS | 0 refills | Status: AC

## 2023-06-08 ENCOUNTER — Other Ambulatory Visit (HOSPITAL_BASED_OUTPATIENT_CLINIC_OR_DEPARTMENT_OTHER): Payer: Self-pay

## 2023-12-03 DIAGNOSIS — M170 Bilateral primary osteoarthritis of knee: Secondary | ICD-10-CM | POA: Diagnosis not present

## 2023-12-03 DIAGNOSIS — G4700 Insomnia, unspecified: Secondary | ICD-10-CM | POA: Diagnosis not present

## 2023-12-03 DIAGNOSIS — R7301 Impaired fasting glucose: Secondary | ICD-10-CM | POA: Diagnosis not present

## 2023-12-03 DIAGNOSIS — F3341 Major depressive disorder, recurrent, in partial remission: Secondary | ICD-10-CM | POA: Diagnosis not present

## 2023-12-03 DIAGNOSIS — G43009 Migraine without aura, not intractable, without status migrainosus: Secondary | ICD-10-CM | POA: Diagnosis not present

## 2023-12-03 DIAGNOSIS — E782 Mixed hyperlipidemia: Secondary | ICD-10-CM | POA: Diagnosis not present

## 2023-12-17 DIAGNOSIS — Z6830 Body mass index (BMI) 30.0-30.9, adult: Secondary | ICD-10-CM | POA: Diagnosis not present

## 2023-12-17 DIAGNOSIS — M5416 Radiculopathy, lumbar region: Secondary | ICD-10-CM | POA: Diagnosis not present

## 2024-01-04 DIAGNOSIS — D72819 Decreased white blood cell count, unspecified: Secondary | ICD-10-CM | POA: Diagnosis not present

## 2024-01-13 DIAGNOSIS — H2513 Age-related nuclear cataract, bilateral: Secondary | ICD-10-CM | POA: Diagnosis not present

## 2024-01-13 DIAGNOSIS — H5203 Hypermetropia, bilateral: Secondary | ICD-10-CM | POA: Diagnosis not present

## 2024-01-23 IMAGING — MG MM DIGITAL SCREENING BILAT W/ TOMO AND CAD
6 of 10 series · 6 of 30 positions shown · non-contrast
Comparison: Previous exam(s).

CLINICAL DATA: Screening.

EXAM:
DIGITAL SCREENING BILATERAL MAMMOGRAM WITH TOMOSYNTHESIS AND CAD
TECHNIQUE: Bilateral screening digital craniocaudal and mediolateral oblique
mammograms were obtained. Bilateral screening digital breast
tomosynthesis was performed. The images were evaluated with
computer-aided detection.

[L MLO synth-2D (1 of 2)]
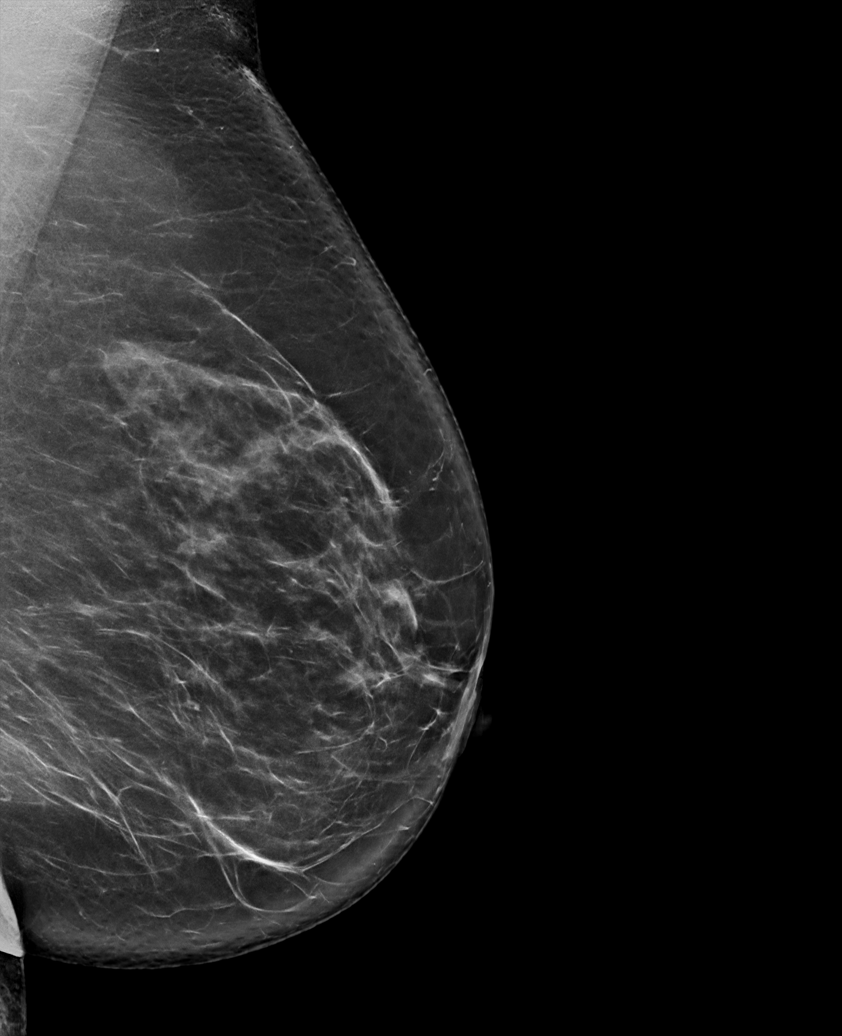

[R CC synth-2D]
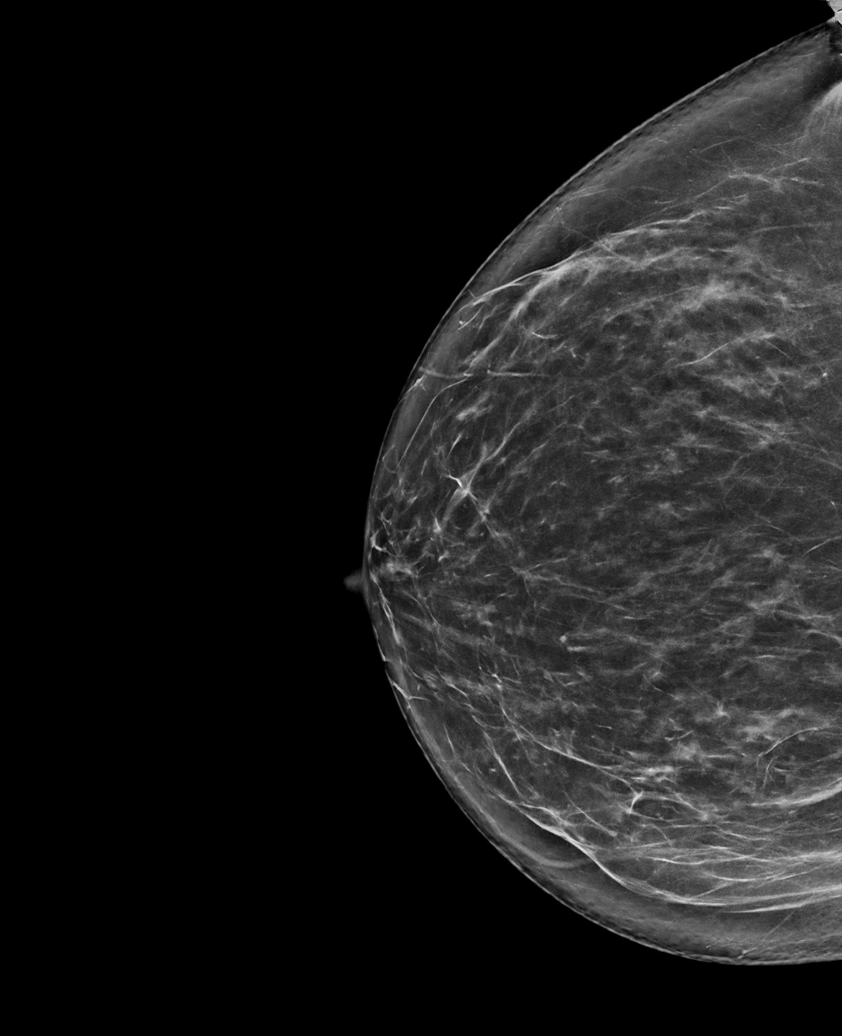

[R MLO synth-2D]
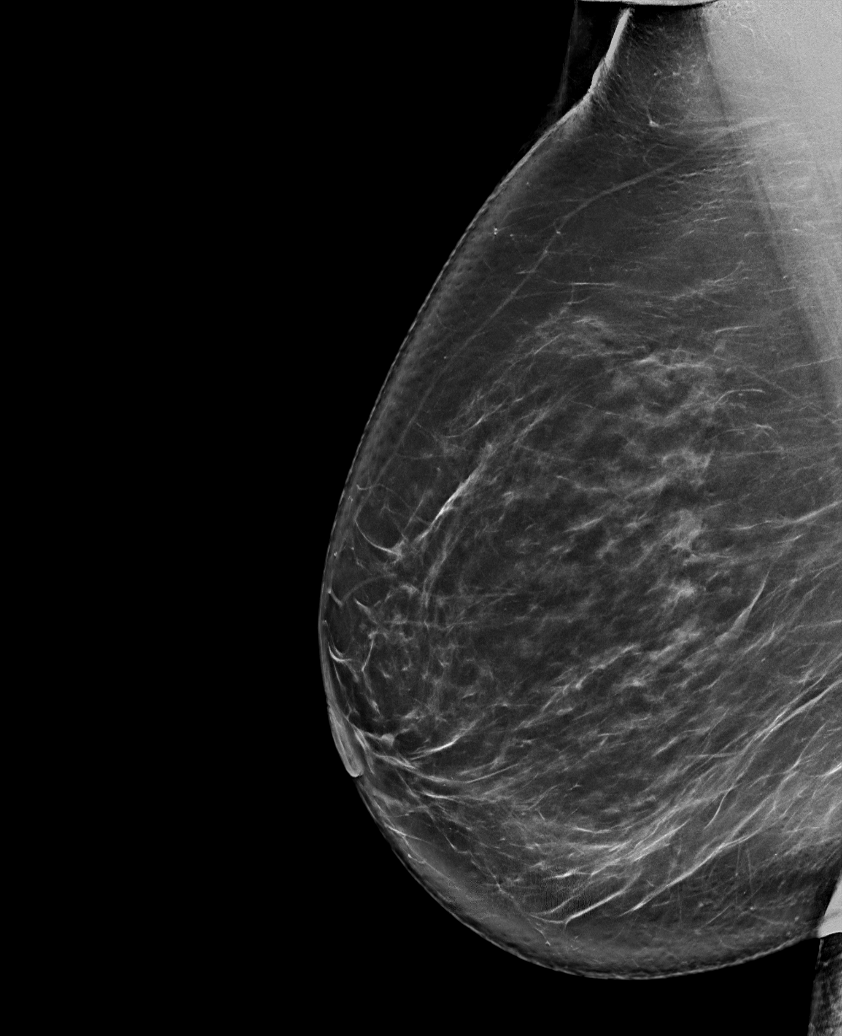

[L CC synth-2D]
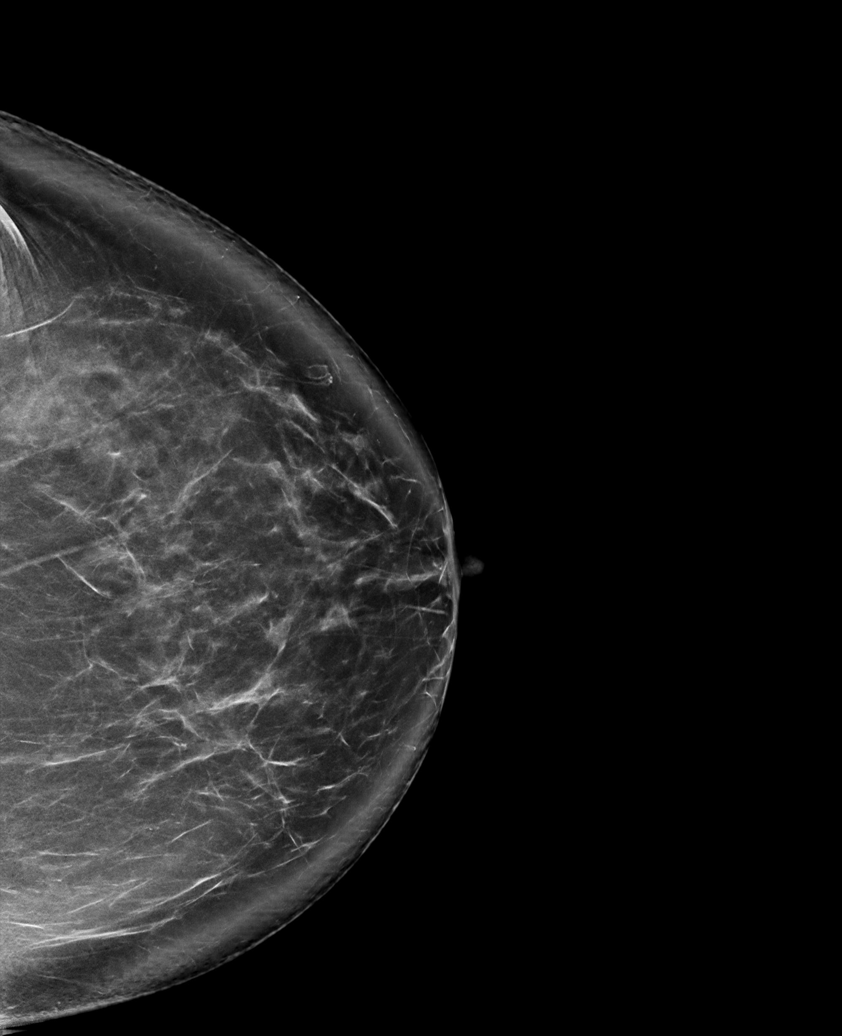

[L MLO synth-2D (2 of 2)]
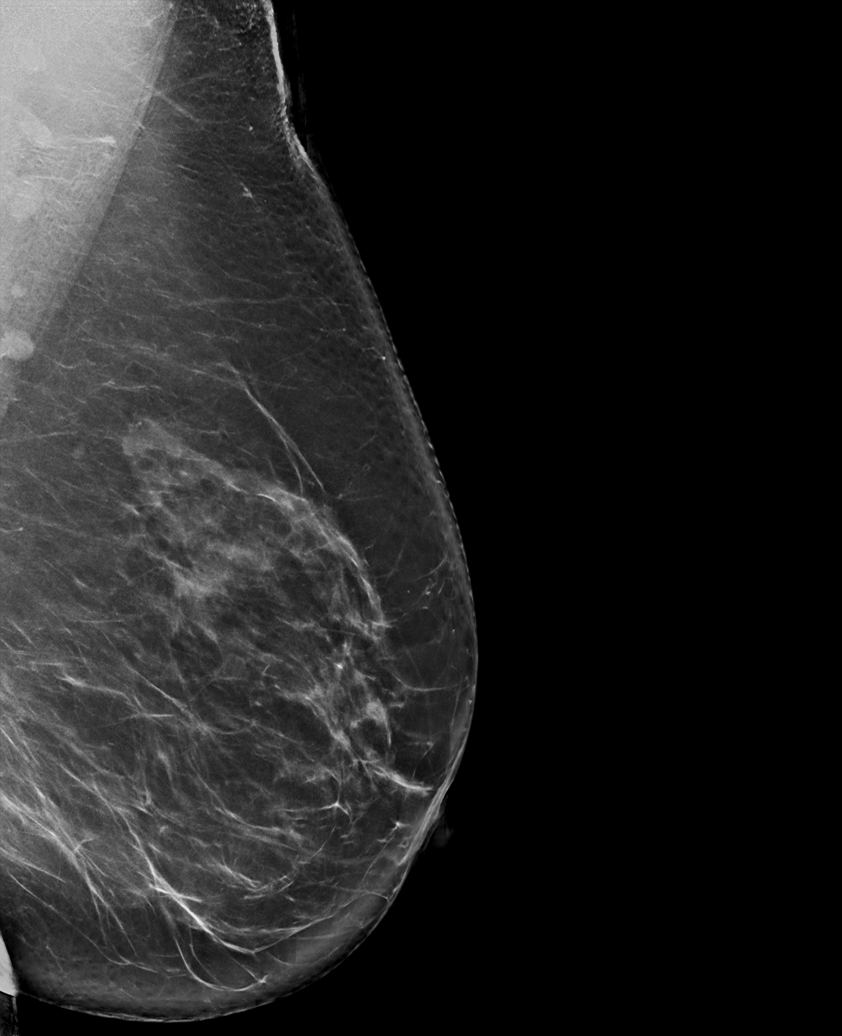

[R MLO tomo · tomo slice 49/96.0]
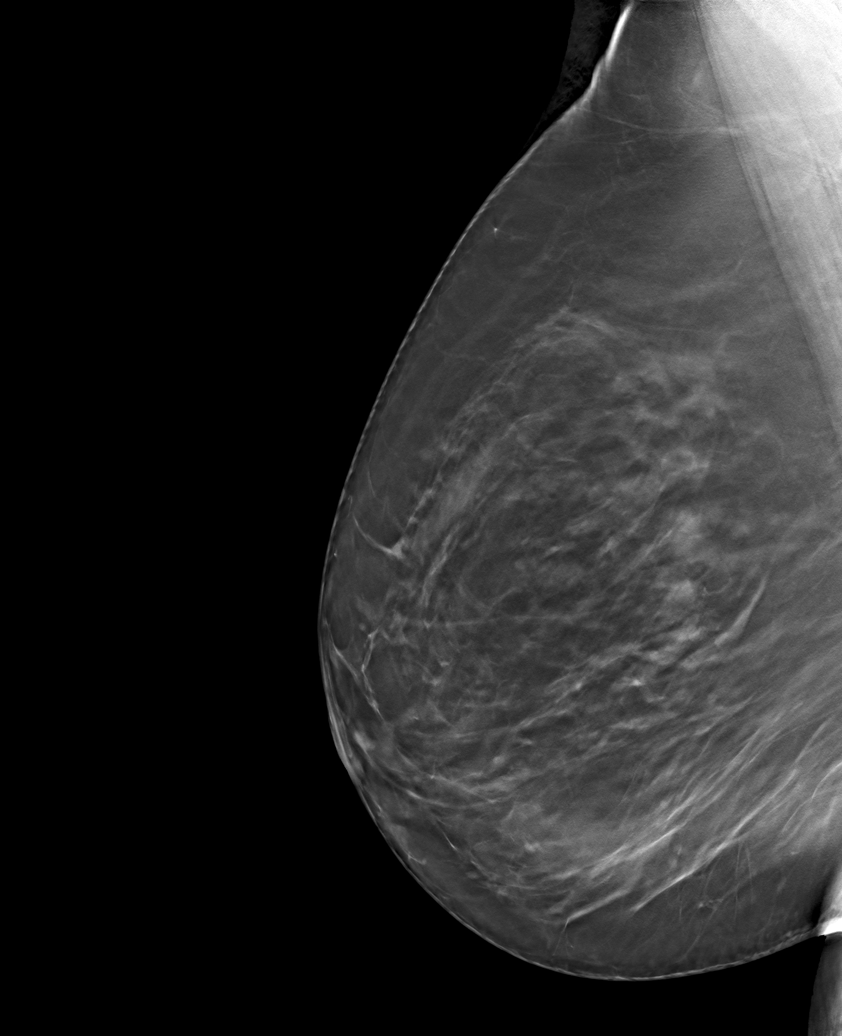

[6 of 30 positions shown; findings below may reference images not displayed]

ACR Breast Density Category b: There are scattered areas of
fibroglandular density.
FINDINGS: There are no findings suspicious for malignancy.
IMPRESSION: No mammographic evidence of malignancy. A result letter of this
screening mammogram will be mailed directly to the patient.

RECOMMENDATION:
Screening mammogram in one year. (Code:51-O-LD2)

BI-RADS CATEGORY  1: Negative.
